# Patient Record
Sex: Female | Born: 1960 | Race: White | Hispanic: No | Marital: Married | Smoking: Current every day smoker
Health system: Southern US, Community
[De-identification: ages and names within clinical notes are randomized; demographics above are authoritative.]

## PROBLEM LIST (undated history)

## (undated) DIAGNOSIS — G629 Polyneuropathy, unspecified: Secondary | ICD-10-CM

## (undated) DIAGNOSIS — E039 Hypothyroidism, unspecified: Secondary | ICD-10-CM

## (undated) DIAGNOSIS — E119 Type 2 diabetes mellitus without complications: Secondary | ICD-10-CM

## (undated) HISTORY — PX: TUBAL LIGATION: SHX77

## (undated) HISTORY — PX: SHOULDER SURGERY: SHX246

---

## 1998-06-19 ENCOUNTER — Other Ambulatory Visit: Admission: RE | Admit: 1998-06-19 | Discharge: 1998-06-19 | Payer: Self-pay | Admitting: Obstetrics and Gynecology

## 1999-01-18 ENCOUNTER — Other Ambulatory Visit: Admission: RE | Admit: 1999-01-18 | Discharge: 1999-01-18 | Payer: Self-pay | Admitting: Obstetrics and Gynecology

## 1999-10-19 ENCOUNTER — Ambulatory Visit (HOSPITAL_COMMUNITY): Admission: RE | Admit: 1999-10-19 | Discharge: 1999-10-19 | Payer: Self-pay | Admitting: Obstetrics and Gynecology

## 2000-03-14 ENCOUNTER — Other Ambulatory Visit: Admission: RE | Admit: 2000-03-14 | Discharge: 2000-03-14 | Payer: Self-pay | Admitting: Obstetrics and Gynecology

## 2001-04-30 ENCOUNTER — Other Ambulatory Visit: Admission: RE | Admit: 2001-04-30 | Discharge: 2001-04-30 | Payer: Self-pay | Admitting: Obstetrics and Gynecology

## 2002-07-10 ENCOUNTER — Encounter: Admission: RE | Admit: 2002-07-10 | Discharge: 2002-07-10 | Payer: Self-pay | Admitting: Obstetrics and Gynecology

## 2002-07-10 ENCOUNTER — Encounter: Payer: Self-pay | Admitting: Obstetrics and Gynecology

## 2003-01-14 ENCOUNTER — Ambulatory Visit (HOSPITAL_COMMUNITY): Admission: RE | Admit: 2003-01-14 | Discharge: 2003-01-14 | Payer: Self-pay | Admitting: Endocrinology

## 2003-01-14 ENCOUNTER — Encounter: Payer: Self-pay | Admitting: Endocrinology

## 2003-07-25 ENCOUNTER — Encounter: Admission: RE | Admit: 2003-07-25 | Discharge: 2003-07-25 | Payer: Self-pay | Admitting: Obstetrics and Gynecology

## 2004-06-10 ENCOUNTER — Inpatient Hospital Stay (HOSPITAL_COMMUNITY): Admission: EM | Admit: 2004-06-10 | Discharge: 2004-06-11 | Payer: Self-pay | Admitting: Internal Medicine

## 2004-08-06 ENCOUNTER — Encounter: Admission: RE | Admit: 2004-08-06 | Discharge: 2004-08-06 | Payer: Self-pay | Admitting: Obstetrics and Gynecology

## 2005-10-05 ENCOUNTER — Encounter: Admission: RE | Admit: 2005-10-05 | Discharge: 2005-10-05 | Payer: Self-pay | Admitting: Obstetrics and Gynecology

## 2006-02-07 ENCOUNTER — Ambulatory Visit (HOSPITAL_COMMUNITY): Admission: RE | Admit: 2006-02-07 | Discharge: 2006-02-07 | Payer: Self-pay | Admitting: Endocrinology

## 2006-03-10 ENCOUNTER — Inpatient Hospital Stay (HOSPITAL_COMMUNITY): Admission: EM | Admit: 2006-03-10 | Discharge: 2006-03-11 | Payer: Self-pay | Admitting: Emergency Medicine

## 2006-11-07 ENCOUNTER — Encounter: Admission: RE | Admit: 2006-11-07 | Discharge: 2006-11-07 | Payer: Self-pay | Admitting: Obstetrics and Gynecology

## 2007-10-12 ENCOUNTER — Ambulatory Visit (HOSPITAL_BASED_OUTPATIENT_CLINIC_OR_DEPARTMENT_OTHER): Admission: RE | Admit: 2007-10-12 | Discharge: 2007-10-12 | Payer: Self-pay | Admitting: Orthopedic Surgery

## 2009-05-13 ENCOUNTER — Encounter: Admission: RE | Admit: 2009-05-13 | Discharge: 2009-05-13 | Payer: Self-pay | Admitting: Endocrinology

## 2010-05-24 ENCOUNTER — Other Ambulatory Visit: Payer: Self-pay | Admitting: Obstetrics and Gynecology

## 2010-05-24 DIAGNOSIS — Z1231 Encounter for screening mammogram for malignant neoplasm of breast: Secondary | ICD-10-CM

## 2010-05-31 ENCOUNTER — Ambulatory Visit
Admission: RE | Admit: 2010-05-31 | Discharge: 2010-05-31 | Disposition: A | Discharge status: Home / Self Care | Payer: Managed Care, Other (non HMO) | Source: Ambulatory Visit | Attending: Obstetrics and Gynecology | Admitting: Obstetrics and Gynecology

## 2010-05-31 DIAGNOSIS — Z1231 Encounter for screening mammogram for malignant neoplasm of breast: Secondary | ICD-10-CM

## 2010-08-31 NOTE — Op Note (Signed)
NAME:  [name] [name], [name] [name]            ACCOUNT NO.:  [id number]   MEDICAL RECORD NO.:  [id number]          PATIENT TYPE:  AMB   LOCATION:  DSC                          FACILITY:  [facility]   PHYSICIAN:  Harvie Junior, M.D.   DATE OF BIRTH:  09/19/1960   DATE OF PROCEDURE:  DATE OF DISCHARGE:                               OPERATIVE REPORT   PREOPERATIVE DIAGNOSIS:  Stiff shoulder with impingement.   POSTOPERATIVE DIAGNOSES:  1. Stiff shoulder with impingement.  2. Labral tear posterior superior.   PROCEDURE:  1. Arthroscopic acromioplasty from the lateral and posterior      compartment.  2. Arthroscopic debridement in the humeral joint of posterior and      superior labral tear as well as scare tissue from manipulation.  3. Manipulations   ANESTHESIA:  General.   SURGEON:  Harvie Junior, MD.   ANESTHESIA:  General.   BRIEF HISTORY:  [name] is a 50 year old female with a long history  of having stiff right shoulder, which we treated conservatively for a  period of time and done injection therapy which had initially worked but  then failed.  We had attempted trying to get herself cured  nonoperatively because of her continued complaint of stiffness and pain,  she was also taken to the operating room for subacromial decompression  and manipulation under anesthesia.   PROCEDURE:  The patient was taken to the operating room.  After adequate  anesthesia obtained with general anesthetic the patient was placed  in  beach-chair position, all bony prominence was well padded.  At this  point, the patient was manipulated and easily with the break of scar  tissue into his 70 of external rotation, 180 degrees of right elevation,  and 90 internal rotation with the arm at the side.  We could manipulate  her arm behind her back.  At this point, the shoulder was prepped and  draped in usual sterile fashion. Following this routine arthroscopic  examination revealed the glenohumeral joint there  was a posterior  superior labral tear as well as anterior significant scar and synovium  from the tear.  We then debrided the posterior and superior labral tears  as well as anterior scar tissue and then took the blunt portion of the  shaver and went up in between the superior labrum and rotator cuff on  the undersurface to free this area off.  Once this was completed, the  rotator cuff was evaluated undersurface noted to be normal.  Attention  at this time was turned to the glenohumeral joint and subacromial space  and anterior lateral acromioplasty was performed to lateral and  posterior compartment.  There was a large pretty sharp spur right in the  central portion of the acromion.  Once acromioplasty was performed, then  the subtotal bursectomy was performed anterior and posterior mid and  also medially.  Once this was completed, the shoulder was put through  range of motion.  There was no impinging or limiting factors at this  point.  At this point, the shoulder was copiously and  thoroughly irrigated, suctioned dry, all subportals  were closed with a  bandage.  Sterile compression dressing was applied.  The patient taken  to the recovery room and she was noted to be in satisfactory condition.  Estimated blood loss for this procedure was none.      Harvie Junior, M.D.  Electronically Signed     JLG/MEDQ  D:  10/12/2007  T:  10/13/2007  Job:  [id number]

## 2010-09-03 NOTE — Op Note (Signed)
[facility]  Patient:    [name] [name], [name] [name]                   MRN: [id number] Proc. Date: 10/19/99 Adm. Date:  [id number] Attending:  Malon Kindle                           Operative Report  PREOPERATIVE DIAGNOSES:  Voluntary sterilization, diabetes.  POSTOPERATIVE DIAGNOSES:  Voluntary sterilization, diabetes.  OPERATION FORMED:  Laparoscopic tubal cauterization.  SURGEON:  Malachi Pro. Ambrose Mantle, M.D.  ANESTHESIA:  General.  DESCRIPTION OF PROCEDURE:  The patient was brought to the operating room and placed under satisfactory general anesthesia. Her last blood sugar had been 187. She was placed in the [place] stirrups. The abdomen, vulva, vagina and urethra were prepped with Betadine solution and draped as a sterile field after the bladder was emptied with a [place] catheter and a [place] cannula was placed into the uterus. The uterus was anterior small. The adnexa were free of masses. A small incision was made in the inferior portion of the umbilicus. A [place] cannula was placed into the abdominal cavity. Aspiration revealed it was not in a blood vessel, not in bowel or urinary contents. Saline was pushed in, none could be recovered. I then elevated the abdominal wall and got a negative pressure. I then placed 2.8 liters of CO2 with a filling pressure of 4-8 mmHg. The incision was slightly enlarged so the laparoscopic trocar could be inserted without difficulty. I confirmed its intraperitoneal position and then placed a smaller trocar suprapubically under direct vision. The blunt probe was used to manipulate the pelvic organs into operating position. The uterus was anterior and small. Both tubes were delicate and normal to their fimbriated ends. Both ovaries were normal and the cul-de-sacs were free of disease and the uterus was normal size. Exploration of the upper abdomen revealed the liver to be smooth. I did not see the gallbladder. I did not  see any upper abdominal abnormalities. I cauterized both tubes in 3 or 4 consecutive locations starting with the mid portion of each tube and going toward the cornu. I continued the cauterization at each location until the meter read 0 and the tube appeared white. No complications were encountered. The lower abdominal trocar was removed. No bleeding was present at that site. I then released all the pneumoperitoneum, removed the umbilical trocar and then slowly removed the laparoscope to visualize the umbilical incision and there was no bleeding. I closed the umbilical incision with 3 sutures of 3-0 plain catgut, the lower incision with several Steri-Strips. The Hulka cannula was removed and the patient returned to recovery in satisfactory condition. Blood loss was less than 5 cc. DD:  10/19/99 TD:  10/19/99 Job: [id number]

## 2010-09-03 NOTE — Discharge Summary (Signed)
NAME:  [name], [name]            ACCOUNT NO.:  [id number]   MEDICAL RECORD NO.:  [id number]          PATIENT TYPE:  INP   LOCATION:  0153                         FACILITY:  [facility]   PHYSICIAN:  Gaspar Garbe, M.D.DATE OF BIRTH:  10-Apr-1961   DATE OF ADMISSION:  06/10/2004  DATE OF DISCHARGE:  06/11/2004                                 DISCHARGE SUMMARY   DISCHARGE DIAGNOSES:  1.  Diabetic ketoacidosis.  2.  Diabetes mellitus type 1, on MiniMed Paradigm pump.  3.  Hyperlipidemia.  4.  Hypothyroidism.  5.  Mild elevated blood pressure.   MEDICATIONS AT DISCHARGE:  1.  Lantus 18 units subcu evening of discharge. Continue sliding scale with      Humalog with pump except using syringes.  2.  The patient is to resume her pump tomorrow after replacement arrives per      [employer] by [employer]Ex by noon. She is to monitor her blood sugars every two      hours through the evening time on the initiation of pump.  3.  Zocor 20 mg p.o. q.d.  4.  Altace 2.5 mg p.o. q.d.  5.  Synthroid 125 mg p.o. q.d.  6.  The patient may stop her Avandia.   SUMMARY OF HOSPITAL COURSE:  The patient was admitted from the office  June 10, 2004 after having nausea and vomiting for two days. She notes  that her blood sugars were high on February 22 and was talked through giving  boluses by syringe. She reset her pump, used new infusion sets and new  insulin, and continued to have problems through to the morning of February  23 where she was seen in the office, found to be ketotic, and unable to keep  down p.o. fluids. She was directly admitted to [facility] critical care  step-down area where she received insulin per Glucomander, IV fluids,  converted over to IV fluids with D5 once achieving relative normal glycemia  and was hydrated overnight.   The patient was started with her MiniMed Paradigm pump; however, had a  malfunction the morning of her discharge. She was seen by the [employer]  representative  and her educators. Arrangements were made for her to get a  new pump sent by Fed-Ex tomorrow. In the meantime, she has been using  Humalog per sliding scale and will resume this sliding scale at home. I  brought her a sample bottle of Lantus which she has used before to use  evening at 18 units. Her normal pump basals are between 20 and 22 units. She  is continued to monitor her sugars closely until she resumes the pump and  following resumption of her pump to keep her logs and call the office on  Monday with an update with what is going on.   The patient indicated that the [employer] educator found that she was not  inserting her cannula correctly although she has been doing the exactly the  same way for 11 months now. The patient brings this into question. I am  considering that she may need to use a different type of inserter  if this is  the case or else she may do better with a different, less complicated  inserter as well. This will be discussed with Ronal Fear, our pharmacist  and diabetic educator on upcoming appointment. The patient was discharged in  good condition in the care of her husband.   LABORATORY DATA:  Sodium 136, potassium 3.2, glucose 101 done at noon. White  count had dropped from 17.3 to 12.6 with hydration. Normal platelets 299.  Normal hemoglobin and hematocrit of 12.1 and 34.7. Anion gap had closed. BUN  and creatinine are 10 and 0.7 respectively with a calcium of 8.7, chloride  of 104 with a bicarb up to 27 from 18 previously. The patient had a slightly  suppressed TSH of 0.328. No adjustment was made in her medication. She had a  negative nasal swab, had a negative chest x-ray, and had had a negative  urinalysis for infection at the office.   DISCHARGE INSTRUCTIONS:  As listed above for reinitiation of pump and to  call on Monday.   Condition on discharge:  Good      RWT/MEDQ  D:  06/11/2004  T:  06/12/2004  Job:  [id number]

## 2010-09-03 NOTE — H&P (Signed)
NAME:  [name] [name], [name] [name]            ACCOUNT NO.:  [id number]   MEDICAL RECORD NO.:  [id number]          PATIENT TYPE:  INP   LOCATION:  0153                         FACILITY:  [facility]   PHYSICIAN:  Gaspar Garbe, M.D.DATE OF BIRTH:  April 01, 1961   DATE OF ADMISSION:  06/10/2004  DATE OF DISCHARGE:                                HISTORY & PHYSICAL   CHIEF COMPLAINT:  Nausea and vomiting, diabetic ketoacidosis.   HISTORY OF PRESENT ILLNESS:  The patient is a 50 year old white female who  has a history of diabetes mellitus type 1 on insulin pump.  She has had  recurrent pump, a Paradigm MiniMed for approximately three years now.  She  first began having difficulties with her blood sugars about two to three  days ago and over the course of yesterday, was given insulin by direct  injection rather than her pump and was able to achieve relative normal  glycemia with blood sugars in the 110-150 range as they had been greater  than 500 previously.  Over the course of the night, her blood sugars  worsened and her blood sugar this morning was reported in the upper to mid  500's, came into the office and her blood sugar was 496.  Her urinalysis  revealed 2+ ketones.  She is therefore being admitted to the hospital for  diabetic ketoacidosis, likely due to pump malfunction.   ALLERGIES:  No known drug allergies.   MEDICATIONS:  1.  Humalog per insulin pump.  2.  Altace 2.5 mg per day.  3.  Avandia 4 mg daily.  4.  Synthroid 125 mcg daily.  5.  Zocor 20 mg daily.   PAST MEDICAL HISTORY:  1.  Diabetes mellitus type 1 in 1995.  She had no unusual presentation and      has some aspects of type 2 which is why, per Dr. Evlyn Kanner, the Avandia had      been used.  2.  Hyperlipidemia.  3.  Hypothyroidism.   SOCIAL HISTORY:  The patient lives in [place] with her husband.  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   FAMILY HISTORY:  Mother  has a history of primary biliary cirrhosis.  Father  was diagnosed with brain cancer in November of this past year.   REVIEW OF SYSTEMS:  The patient complains mostly of weakness, nausea and  vomiting.  She was unable to sit up fully and has been unable to keep down  fluids or food within the past one to two days reliably.  She denies any  fevers or chills, indicates that she has a little bit of a headache but  denies any palpitations.   ADVANCED DIRECTIVES:  The patient is a full code.   PHYSICAL EXAMINATION:  VITAL SIGNS:  Temperature 98.5, pulse 138,  respiratory rate 18, blood pressure 144/72, sating 100% on room air.  GENERAL:  The patient is ill-appearing, appears to be uncomfortable.  HEENT:  Normocephalic, atraumatic.  PERRLA.  EOMI.  ENT is within normal  limits.  NECK:  Supple.  No lymphadenopathy, JVD or bruits.  HEART:  Tachycardic.  No murmur appreciated.  ABDOMEN:  Soft, mildly tender secondary to retching with hyperactive bowel  sounds.  EXTREMITIES:  No cyanosis, clubbing or edema.  MUSCULOSKELETAL:  A 5/5 strength bilaterally.  NEUROLOGICAL:  Oriented x3.  Cranial nerves II-XII are intact without  photophobia.   LABORATORIES:  Done in the office show a CBG of 496 and 2+ ketones in her  urine.  The rest are currently pending for admission.   ASSESSMENT AND PLAN:  1.  Diabetic ketoacidosis.  The patient's diabetic ketoacidosis is most      likely secondary to the pump not working properly.  Will try to contact      [employer] in the meantime and will retry her pump with a new set,      reservoir, insulin once she is out of diabetic ketoacidosis.  At this      point, she is being admitted to the step down area of [facility] with IV sedation, normal saline 200 cc an hour to be switched      over to D-5 one-half at 125 when she has blood sugars less than 200 x2      hours.  She will be on a Glucometer protocol with a goal blood sugar of      100.  This  will be managed through Regular Insulin IV drop.  She is to      receive 10 units of Humalog subcutaneous upon arrival.  2.  Hyperlipidemia.  We will continue her on her Zocor if she can hold it      down, otherwise will hold.  3.  Question respiratory illness.  The patient indicates that she had some      flu-like symptoms which I suspect are most likely diabetic ketoacidosis.      Will check her with a flu swab and a chest x-ray just to make sure there      is no evidence of bacteria on her urinalysis.  4.  Protonix will be given for gastrointestinal prophylaxis.  Will hold her      off on Lovenox as she will most likely not be bed bound or hospitalized      for a long period of time.  5.  Will receive Phenergan for nausea, Tylenol for fever as needed.      RWT/MEDQ  D:  06/10/2004  T:  06/10/2004  Job:  [id number]   cc:   Jeannett Senior A. Evlyn Kanner, M.D.  [address]  [place]  [place] [address]  Fax: [phone]

## 2010-09-03 NOTE — H&P (Signed)
NAME:  [name], [name]            ACCOUNT NO.:  [id number]   MEDICAL RECORD NO.:  [id number]           PATIENT TYPE:   LOCATION:                                 FACILITY:   PHYSICIAN:  Geoffry Paradise, M.D.       DATE OF BIRTH:   DATE OF ADMISSION:  03/10/2006  DATE OF DISCHARGE:                                HISTORY & PHYSICAL   CHIEF COMPLAINT:  Nausea and vomiting, DKA.   HISTORY OF PRESENT ILLNESS:  [name] is a 50 year old patient of Dr.  Kassie Mends with longstanding diabetes mellitus type 1 since 1995, presenting at  this time with nausea, vomiting and elevated blood sugars since  approximately 7 a.m. this morning.  She has had one prior episode of DKA  which she relates secondary to pump failure in the past.  For the most part,  she relates that she is well controlled on an insulin pump and fairly  compliant.  She evidently has felt extremely well with no problems until  awakening at approximately 6 or 7 a.m. this morning with protracted nausea  and vomiting.  Blood sugars have been elevated all day and nausea and  vomiting have continued all day, until I was contacted at approximately 9:30  p.m. resulting in my encouragement of them to present to the emergency room.  In the emergency room, the patient was found to be in frank DKA and she is  admitted for IV fluids and insulin.  She claims that she has given herself  supplemental insulin through the day, but has not checked the pump or  replaced insulin in the pump or insertion sets.  She has had no fever,  chills or sweats.  She denies any headaches or rash.  She has had no  abdominal pain or diarrhea.  She has had no cough, congestion or urinary  symptoms.   PAST MEDICAL HISTORY:  NO KNOWN DRUG ALLERGIES.  Medications include  Synthroid 25 mcg daily, Zocor 20 mg daily, Altace dose unknown.  Medical  illnesses include hypothyroidism, hyperlipidemia and essential hypertension.  Surgical illnesses include a __________  by  Dr. Ambrose Mantle in 1996.   FAMILY HISTORY:  Noncontributory.   SOCIAL HISTORY:  The patient is married and does not have children.  Denies  alcohol or cigarettes.   PHYSICAL EXAM:  Temperature 96.7, blood pressure 80 manual, pulse  120/regular, respiratory rate 26, O2 saturation 96%. The patient appears  ill, just vomited, but awake, alert, interactive.  HEENT:  Anicteric.  Pupils are round and reactive.  NECK:  No JVD or bruits.  No meningismus.  LUNGS:  Clear bilaterally to auscultation and percussion.  CARDIOVASCULAR:  Regular rate and rhythm, tachy.  No murmur, gallop, rub or  heave.  No S3 or S4.  ABDOMEN:  Good bowel sounds.  Soft, nontender.  No hepatosplenomegaly.  BACK:  No CVA or spinal tenderness.  EXTREMITIES:  Without cyanosis, clubbing or edema.  Warm distally.  Intact  distal pulses.  Joints normal.  No rashes.  NEUROLOGICAL:  She is awake, alert, higher cortical functioning intact.  Moves extremities times four.  She does have an EJ in the left neck.   ASSESSMENT:  1. Diabetic ketoacidosis.  No obvious precipitating infectious etiology,      possible pump or insertion difficulty, but clearly no attempts      throughout the day to intervene.  2. Hypothyroidism.  3. Hyperlipidemia.  4. Essential hypertension.  5. Renal failure, unclear, acute versus chronic.   PLAN:  The patient is admitted for aggressive hydration, continuous insulin  administration, antiemetics and further pending her course.           ______________________________  Geoffry Paradise, M.D.     RA/MEDQ  D:  03/10/2006  T:  03/10/2006  Job:  [id number]

## 2010-09-03 NOTE — Discharge Summary (Signed)
NAME:  [name], [name]            ACCOUNT NO.:  [id number]   MEDICAL RECORD NO.:  [id number]          PATIENT TYPE:  INP   LOCATION:  0162                         [facility]ACILITY:  [facility]   PHYSICIAN:  Tera Mater. Evlyn Kanner, M.D. DATE OF BIRTH:  02-02-1961   DATE OF ADMISSION:  03/09/2006  DATE OF DISCHARGE:  03/11/2006                               DISCHARGE SUMMARY   DISCHARGE DIAGNOSES:  1. Severe diabetic ketoacidosis due to dysfunction.  2. Acute renal failure, resolved.  3. Hypertension.  4. Hypothyroidism.   [name] is a 50 year old white female, longstanding type 1 diabetic  patient of mine. She presented to my partner, Dr. Jacky Kindle, on the early  morning hours of March 10, 2006 with severe diabetic ketoacidosis.  She apparently has had nausea and vomiting and elevated blood sugars  from 7 a.m. that morning. She has only had one episode of DKA in the  past which was also associated with pump failure. She was fairly sick at  presentation. She was hypotensive, tachycardic, with marked metabolic  disarray. Fortunately, since this came on in a fairly short period of  time, with IV fluids and IV insulin and appropriate resuscitative  measures, she turned around fairly quickly. She had IV Glucomander used.  She was transitioned back to her pump when she was taking food in. There  was no evidence of an infectious cause underlying this. In general, it  was noted she turned around fairly quickly despite the severity of  metabolic disarray.   LABORATORY DATA AT PRESENTATION:  PH 7.041, pCO2 of 18, pO2 of 131.  Repeat at 1 a.m., 7.117, 18 and 127. Initial white count was 28,500,  dropped down to 19,900 on the 24th. Initial hemoglobin 14.6, dropped to  11.6 with hydration. Platelets 436,000, dropping down to 262,000.  Initial chemistries:  Sodium 127, potassium 6.4, chloride 92, CO2 6, BUN  42, creatinine 2.3, glucose of 994, calcium 9.6, total protein 7,  albumin 4.2. Gradually, her  acidosis resolved. Her final chemistries:  Sodium 136, potassium 2.9, chloride 110, CO2 23, BUN 9, creatinine 0.7,  glucose of 95, calcium 9.1. Total bilirubin was up at presentation at  1.8. AST was normal at 27, ALT 27, alkaline phosphatase of 105. A false  positive urinary pregnancy test was noted that was confirmed to be  clearly negative by quantitive HCG; not sure of the reason for this, but  the patient had her period 2 weeks before and clearly was not pregnant.  Serum acetone was positive at presentation. There was 40 mg of ketone in  her urine.   In summary, we have a 50 year old white female with known type 1  diabetes developing nausea and vomiting and diabetic ketoacidosis due to  pump dysfunction. She was in significant metabolic disarray, but  fortunately, her general health is good, and she turned around quickly.  A new pump was delivered, and she was placed on this and discharged home  in improved condition on March 11, 2006. Her diet was to be as  before. She was to use her prior pump settings. She was to return to her  medications of Synthroid, Zocor, and Altace. She is to follow up with me  in one week.           ______________________________  Tera Mater. Evlyn Kanner, M.D.    SAS/MEDQ  D:  04/13/2006  T:  04/13/2006  Job:  [id number]

## 2011-01-13 LAB — POCT I-STAT, CHEM 8
Calcium, Ion: 1.17
Chloride: 102
HCT: 36
Hemoglobin: 12.2
Potassium: 4.2
TCO2: 26

## 2011-01-13 LAB — POCT HEMOGLOBIN-HEMACUE: Hemoglobin: 13

## 2014-10-23 ENCOUNTER — Ambulatory Visit
Admission: RE | Admit: 2014-10-23 | Discharge: 2014-10-23 | Disposition: A | Discharge status: Home / Self Care | Payer: Managed Care, Other (non HMO) | Source: Ambulatory Visit

## 2014-10-23 ENCOUNTER — Other Ambulatory Visit: Payer: Self-pay

## 2014-10-23 DIAGNOSIS — Z1231 Encounter for screening mammogram for malignant neoplasm of breast: Secondary | ICD-10-CM

## 2015-10-19 ENCOUNTER — Other Ambulatory Visit: Payer: Self-pay | Admitting: Endocrinology

## 2015-10-19 DIAGNOSIS — Z1231 Encounter for screening mammogram for malignant neoplasm of breast: Secondary | ICD-10-CM

## 2015-10-30 ENCOUNTER — Ambulatory Visit
Admission: RE | Admit: 2015-10-30 | Discharge: 2015-10-30 | Disposition: A | Discharge status: Home / Self Care | Payer: Managed Care, Other (non HMO) | Source: Ambulatory Visit | Attending: Endocrinology | Admitting: Endocrinology

## 2015-10-30 DIAGNOSIS — Z1231 Encounter for screening mammogram for malignant neoplasm of breast: Secondary | ICD-10-CM

## 2016-12-26 ENCOUNTER — Other Ambulatory Visit: Payer: Self-pay | Admitting: Endocrinology

## 2016-12-26 DIAGNOSIS — Z1231 Encounter for screening mammogram for malignant neoplasm of breast: Secondary | ICD-10-CM

## 2017-01-03 ENCOUNTER — Ambulatory Visit
Admission: RE | Admit: 2017-01-03 | Discharge: 2017-01-03 | Disposition: A | Discharge status: Home / Self Care | Payer: Managed Care, Other (non HMO) | Source: Ambulatory Visit | Attending: Endocrinology | Admitting: Endocrinology

## 2017-01-03 ENCOUNTER — Encounter: Payer: Self-pay | Admitting: Radiology

## 2017-01-03 DIAGNOSIS — Z1231 Encounter for screening mammogram for malignant neoplasm of breast: Secondary | ICD-10-CM

## 2018-04-27 ENCOUNTER — Other Ambulatory Visit: Payer: Self-pay | Admitting: Endocrinology

## 2018-04-27 DIAGNOSIS — Z1231 Encounter for screening mammogram for malignant neoplasm of breast: Secondary | ICD-10-CM

## 2018-05-28 ENCOUNTER — Ambulatory Visit
Admission: RE | Admit: 2018-05-28 | Discharge: 2018-05-28 | Disposition: A | Discharge status: Home / Self Care | Payer: 59 | Source: Ambulatory Visit | Attending: Endocrinology | Admitting: Endocrinology

## 2018-05-28 DIAGNOSIS — Z1231 Encounter for screening mammogram for malignant neoplasm of breast: Secondary | ICD-10-CM

## 2019-04-09 ENCOUNTER — Other Ambulatory Visit: Payer: Self-pay

## 2019-04-09 ENCOUNTER — Ambulatory Visit (HOSPITAL_COMMUNITY)
Admission: RE | Admit: 2019-04-09 | Discharge: 2019-04-09 | Disposition: A | Discharge status: Home / Self Care | Payer: 59 | Source: Ambulatory Visit | Attending: Family | Admitting: Family

## 2019-04-09 ENCOUNTER — Other Ambulatory Visit (HOSPITAL_COMMUNITY): Payer: Self-pay | Admitting: Endocrinology

## 2019-04-09 DIAGNOSIS — R0989 Other specified symptoms and signs involving the circulatory and respiratory systems: Secondary | ICD-10-CM | POA: Diagnosis present

## 2019-04-27 ENCOUNTER — Other Ambulatory Visit: Payer: Self-pay

## 2019-04-27 ENCOUNTER — Ambulatory Visit (INDEPENDENT_AMBULATORY_CARE_PROVIDER_SITE_OTHER): Payer: 59

## 2019-04-27 ENCOUNTER — Encounter: Payer: Self-pay | Admitting: Podiatry

## 2019-04-27 ENCOUNTER — Ambulatory Visit: Payer: 59 | Admitting: Podiatry

## 2019-04-27 VITALS — BP 151/83 | HR 82 | Resp 16

## 2019-04-27 DIAGNOSIS — M79673 Pain in unspecified foot: Secondary | ICD-10-CM

## 2019-04-27 DIAGNOSIS — M2041 Other hammer toe(s) (acquired), right foot: Secondary | ICD-10-CM

## 2019-04-27 DIAGNOSIS — E1149 Type 2 diabetes mellitus with other diabetic neurological complication: Secondary | ICD-10-CM

## 2019-04-27 DIAGNOSIS — M79672 Pain in left foot: Secondary | ICD-10-CM

## 2019-04-27 DIAGNOSIS — M216X9 Other acquired deformities of unspecified foot: Secondary | ICD-10-CM

## 2019-04-27 DIAGNOSIS — M79671 Pain in right foot: Secondary | ICD-10-CM

## 2019-04-27 DIAGNOSIS — M2042 Other hammer toe(s) (acquired), left foot: Secondary | ICD-10-CM

## 2019-04-27 MED ORDER — GABAPENTIN 300 MG PO CAPS: 300.0000 mg | ORAL_CAPSULE | Freq: Every day | ORAL | 0 refills | Status: DC

## 2019-04-27 NOTE — Patient Instructions (Signed)
I have ordered a nerve conduction test. If you do not hear for them about scheduling within the next 1 week, or you have any questions please give [place] a call at [phone].   Gabapentin capsules or tablets What is this medicine? GABAPENTIN (GA ba pen tin) is used to control seizures in certain types of epilepsy. It is also used to treat certain types of nerve pain. This medicine may be used for other purposes; ask your health care provider or pharmacist if you have questions. COMMON BRAND NAME(S): Active-PAC with Gabapentin, Gabarone, Neurontin What should I tell my health care provider before I take this medicine? They need to know if you have any of these conditions:  history of drug abuse or alcohol abuse problem  kidney disease  lung or breathing disease  suicidal thoughts, plans, or attempt; a previous suicide attempt by you or a family member  an unusual or allergic reaction to gabapentin, other medicines, foods, dyes, or preservatives  pregnant or trying to get pregnant  breast-feeding How should I use this medicine? Take this medicine by mouth with a glass of water. Follow the directions on the prescription label. You can take it with or without food. If it upsets your stomach, take it with food. Take your medicine at regular intervals. Do not take it more often than directed. Do not stop taking except on your doctor's advice. If you are directed to break the 600 or 800 mg tablets in half as part of your dose, the extra half tablet should be used for the next dose. If you have not used the extra half tablet within 28 days, it should be thrown away. A special MedGuide will be given to you by the pharmacist with each prescription and refill. Be sure to read this information carefully each time. Talk to your pediatrician regarding the use of this medicine in children. While this drug may be prescribed for children as young as 3 years for selected conditions, precautions do  apply. Overdosage: If you think you have taken too much of this medicine contact a poison control center or emergency room at once. NOTE: This medicine is only for you. Do not share this medicine with others. What if I miss a dose? If you miss a dose, take it as soon as you can. If it is almost time for your next dose, take only that dose. Do not take double or extra doses. What may interact with this medicine? This medicine may interact with the following medications:  alcohol  antihistamines for allergy, cough, and cold  certain medicines for anxiety or sleep  certain medicines for depression like amitriptyline, fluoxetine, sertraline  certain medicines for seizures like phenobarbital, primidone  certain medicines for stomach problems  general anesthetics like halothane, isoflurane, methoxyflurane, propofol  local anesthetics like lidocaine, pramoxine, tetracaine  medicines that relax muscles for surgery  narcotic medicines for pain  phenothiazines like chlorpromazine, mesoridazine, prochlorperazine, thioridazine This list may not describe all possible interactions. Give your health care provider a list of all the medicines, herbs, non-prescription drugs, or dietary supplements you use. Also tell them if you smoke, drink alcohol, or use illegal drugs. Some items may interact with your medicine. What should I watch for while using this medicine? Visit your doctor or health care provider for regular checks on your progress. You may want to keep a record at home of how you feel your condition is responding to treatment. You may want to share this information with your  doctor or health care provider at each visit. You should contact your doctor or health care provider if your seizures get worse or if you have any new types of seizures. Do not stop taking this medicine or any of your seizure medicines unless instructed by your doctor or health care provider. Stopping your medicine  suddenly can increase your seizures or their severity. This medicine may cause serious skin reactions. They can happen weeks to months after starting the medicine. Contact your health care provider right away if you notice fevers or flu-like symptoms with a rash. The rash may be red or purple and then turn into blisters or peeling of the skin. Or, you might notice a red rash with swelling of the face, lips or lymph nodes in your neck or under your arms. Wear a medical identification bracelet or chain if you are taking this medicine for seizures, and carry a card that lists all your medications. You may get drowsy, dizzy, or have blurred vision. Do not drive, use machinery, or do anything that needs mental alertness until you know how this medicine affects you. To reduce dizzy or fainting spells, do not sit or stand up quickly, especially if you are an older patient. Alcohol can increase drowsiness and dizziness. Avoid alcoholic drinks. Your mouth may get dry. Chewing sugarless gum or sucking hard candy, and drinking plenty of water will help. The use of this medicine may increase the chance of suicidal thoughts or actions. Pay special attention to how you are responding while on this medicine. Any worsening of mood, or thoughts of suicide or dying should be reported to your health care provider right away. Women who become pregnant while using this medicine may enroll in the [place] Pregnancy Registry by calling [phone]. This registry collects information about the safety of antiepileptic drug use during pregnancy. What side effects may I notice from receiving this medicine? Side effects that you should report to your doctor or health care professional as soon as possible:  allergic reactions like skin rash, itching or hives, swelling of the face, lips, or tongue  breathing problems  rash, fever, and swollen lymph nodes  redness, blistering, peeling or loosening of  the skin, including inside the mouth  suicidal thoughts, mood changes Side effects that usually do not require medical attention (report to your doctor or health care professional if they continue or are bothersome):  dizziness  drowsiness  headache  nausea, vomiting  swelling of ankles, feet, hands  tiredness This list may not describe all possible side effects. Call your doctor for medical advice about side effects. You may report side effects to FDA at 1-800-FDA-1088. Where should I keep my medicine? Keep out of reach of children. This medicine may cause accidental overdose and death if it taken by other adults, children, or pets. Mix any unused medicine with a substance like cat litter or coffee grounds. Then throw the medicine away in a sealed container like a sealed bag or a coffee can with a lid. Do not use the medicine after the expiration date. Store at room temperature between 15 and 30 degrees C (59 and 86 degrees F). NOTE: This sheet is a summary. It may not cover all possible information. If you have questions about this medicine, talk to your doctor, pharmacist, or health care provider.  2020 Elsevier/Gold Standard (2018-07-06 14:16:43)   Diabetes Mellitus and Foot Care Foot care is an important part of your health, especially when you have diabetes.  Diabetes may cause you to have problems because of poor blood flow (circulation) to your feet and legs, which can cause your skin to:  Become thinner and drier.  Break more easily.  Heal more slowly.  Peel and crack. You may also have nerve damage (neuropathy) in your legs and feet, causing decreased feeling in them. This means that you may not notice minor injuries to your feet that could lead to more serious problems. Noticing and addressing any potential problems early is the best way to prevent future foot problems. How to care for your feet Foot hygiene  Wash your feet daily with warm water and mild soap. Do not  use hot water. Then, pat your feet and the areas between your toes until they are completely dry. Do not soak your feet as this can dry your skin.  Trim your toenails straight across. Do not dig under them or around the cuticle. File the edges of your nails with an emery board or nail file.  Apply a moisturizing lotion or petroleum jelly to the skin on your feet and to dry, brittle toenails. Use lotion that does not contain alcohol and is unscented. Do not apply lotion between your toes. Shoes and socks  Wear clean socks or stockings every day. Make sure they are not too tight. Do not wear knee-high stockings since they may decrease blood flow to your legs.  Wear shoes that fit properly and have enough cushioning. Always look in your shoes before you put them on to be sure there are no objects inside.  To break in new shoes, wear them for just a few hours a day. This prevents injuries on your feet. Wounds, scrapes, corns, and calluses  Check your feet daily for blisters, cuts, bruises, sores, and redness. If you cannot see the bottom of your feet, use a mirror or ask someone for help.  Do not cut corns or calluses or try to remove them with medicine.  If you find a minor scrape, cut, or break in the skin on your feet, keep it and the skin around it clean and dry. You may clean these areas with mild soap and water. Do not clean the area with peroxide, alcohol, or iodine.  If you have a wound, scrape, corn, or callus on your foot, look at it several times a day to make sure it is healing and not infected. Check for: ? Redness, swelling, or pain. ? Fluid or blood. ? Warmth. ? Pus or a bad smell. General instructions  Do not cross your legs. This may decrease blood flow to your feet.  Do not use heating pads or hot water bottles on your feet. They may burn your skin. If you have lost feeling in your feet or legs, you may not know this is happening until it is too late.  Protect your feet  from hot and cold by wearing shoes, such as at the beach or on hot pavement.  Schedule a complete foot exam at least once a year (annually) or more often if you have foot problems. If you have foot problems, report any cuts, sores, or bruises to your health care provider immediately. Contact a health care provider if:  You have a medical condition that increases your risk of infection and you have any cuts, sores, or bruises on your feet.  You have an injury that is not healing.  You have redness on your legs or feet.  You feel burning or tingling in your  legs or feet.  You have pain or cramps in your legs and feet.  Your legs or feet are numb.  Your feet always feel cold.  You have pain around a toenail. Get help right away if:  You have a wound, scrape, corn, or callus on your foot and: ? You have pain, swelling, or redness that gets worse. ? You have fluid or blood coming from the wound, scrape, corn, or callus. ? Your wound, scrape, corn, or callus feels warm to the touch. ? You have pus or a bad smell coming from the wound, scrape, corn, or callus. ? You have a fever. ? You have a red line going up your leg. Summary  Check your feet every day for cuts, sores, red spots, swelling, and blisters.  Moisturize feet and legs daily.  Wear shoes that fit properly and have enough cushioning.  If you have foot problems, report any cuts, sores, or bruises to your health care provider immediately.  Schedule a complete foot exam at least once a year (annually) or more often if you have foot problems. This information is not intended to replace advice given to you by your health care provider. Make sure you discuss any questions you have with your health care provider. Document Revised: 12/26/2018 Document Reviewed: 05/06/2016 Elsevier Patient Education  2020 [employer].

## 2019-04-27 NOTE — Progress Notes (Signed)
BP (!) 151/83   Pulse 82   Resp 16    Subjective:    Patient ID: Inman, female    DOB: [birth year], 59 y.o.   MRN: [id number]  HPI: Dajuana Palen Kunsman is a 59 y.o. female presenting on 04/27/2019 for concerns of burning to her heels as well as in the balls of her foot.  This been on for greater than 6 months and she also feels that she is unbalanced when she walks and she feels that she rolls on the outside with the right side worse than left.  She also states that she could not lift her right leg up as much as the left side.  She states that she did talk to Dr. Forde Dandy and she was referred to have vascular studies performed which were negative.  She stated discussed neuropathy about 1 year ago.  She denies any claudication symptoms.  No recent falls.  Relevant past medical, surgical, family and social history reviewed and updated as indicated. Interim medical history since our last visit reviewed. Allergies and medications reviewed and updated.  Current Outpatient Medications on File Prior to Visit  Medication Sig  . CONTOUR NEXT TEST test strip   . lisinopril (ZESTRIL) 5 MG tablet Take 5 mg by mouth daily.  Marland Kitchen NOVOLOG 100 UNIT/ML injection   . simvastatin (ZOCOR) 20 MG tablet Take 20 mg by mouth daily.  Marland Kitchen SYNTHROID 125 MCG tablet Take 125 mcg by mouth daily.  . Vitamin D, Ergocalciferol, (DRISDOL) 1.25 MG (50000 UT) CAPS capsule Take 50,000 Units by mouth once a week.   No current facility-administered medications on file prior to visit.    Review of systems: Per HPI unless specifically indicated above     Objective:    BP (!) 151/83   Pulse 82   Resp 16   Wt Readings from Last 3 Encounters:  No data found for Wt    Physical Exam  Results for orders placed or performed during the hospital encounter of 10/12/07  I-STAT, chem 8  Result Value Ref Range   Sodium 137    Potassium 4.2    Chloride 102    BUN 13    Creatinine, Ser 0.8    Glucose, Bld 208 (H)    Calcium, Ion 1.17    TCO2 26    Hemoglobin 12.2    HCT 36.0   Hemoglobin-hemacue, POC  Result Value Ref Range   Hemoglobin 13.0 POINT OF CARE RESULT        General: AAO x3, NAD  Dermatological: Skin is warm, dry and supple bilateral. Nails x 10 are well manicured; remaining integument appears unremarkable at this time. There are no open sores, no preulcerative lesions, no rash or signs of infection present.  Vascular: Dorsalis Pedis artery and Posterior Tibial artery pedal pulses are 2/4 bilateral with immedate capillary fill time.There is no pain with calf compression, swelling, warmth, erythema.   Neruologic: Sensation mildly decreased with Semmes Weinstein monofilament.  Musculoskeletal: Upon weightbearing evaluation there is a cavus foot type with the right side worse than left.  Upon gait evaluation she has excessive supination during gait.  It does appear that her strength in dorsiflexion is mildly decreased on the right side compared to the left side.  Hammertoe contractures are present with the right side worse than left.  Prominence the metatarsal heads plantarly.  No area pinpoint tenderness.  Achilles tendon appears to be intact.  Gait: Unassisted, Nonantalgic.   Assessment &  Plan:  59 y.o. female presents for gait instability, type 2 diabetes with neuropathy  -Treatment options discussed including all alternatives, risks, and complications. I discussed both conservative and surgical treatment options.  -X-rays obtained and reviewed.  No evidence of acute fracture identified. -I discussed that likely her symptoms are coming from neuropathy.  Prescribed gabapentin 300 mg at nighttime and titrate up. -Given the discrepancy on the right versus the left arm also can order nerve conduction test. -Ultimately we discussed orthotics and possible bracing to help with the cavus foot type and prevent her from rolling in the instability. -Likely will start physical therapy but will start  after the nerve conduction test. -Dispensed gel metatarsal pads as well as toe crest. -Discussed the importance of daily foot inspection.  -Follow-up after the nerve conduction test with me and she will also be scheduled with Raiford Noble for the orthotics/possible brace or sooner if any problems arise. In the meantime, encouraged to call the office with any questions, concerns, change in symptoms.   Ovid Curd, DPM

## 2019-04-29 ENCOUNTER — Encounter: Payer: Self-pay | Admitting: Neurology

## 2019-04-29 ENCOUNTER — Telehealth: Payer: Self-pay | Admitting: *Deleted

## 2019-04-29 ENCOUNTER — Other Ambulatory Visit: Payer: Self-pay

## 2019-04-29 DIAGNOSIS — E1149 Type 2 diabetes mellitus with other diabetic neurological complication: Secondary | ICD-10-CM

## 2019-04-29 MED ORDER — GABAPENTIN 100 MG PO CAPS: 100.0000 mg | ORAL_CAPSULE | Freq: Every day | ORAL | 0 refills | Status: DC

## 2019-04-29 NOTE — Telephone Encounter (Signed)
Thanks. If she has any side affects from the 100mg  gabapentin then I would stop it all together.

## 2019-04-29 NOTE — Telephone Encounter (Signed)
I informed pt of Dr. Gabriel Rung 04/29/2019 2:24pm orders. Pt states she is still feeling the effects of the Gabapentin today, and her nerve test is 05/14/2019. Pt states she can't cut the capsules. I offered to order the Gabapentin 100mg  and pt states that would be fine.

## 2019-04-29 NOTE — Telephone Encounter (Signed)
Faxed order to [facility].

## 2019-04-29 NOTE — Telephone Encounter (Signed)
-----   Message from Vivi Barrack, DPM sent at 04/27/2019  9:00 AM EST ----- Can you please order a nerve conduction test? Thanks.

## 2019-04-29 NOTE — Telephone Encounter (Signed)
We can try the 100mg  at night of gabapentin and see how that does or if she wanted we can wait until after the nerve conduction test.

## 2019-04-29 NOTE — Telephone Encounter (Signed)
Pt states she was seen 04/27/2019 and prescribed gabapentin. Pt states she took one and it is too strong, she was loopy all day, and will not take anymore.

## 2019-05-13 ENCOUNTER — Other Ambulatory Visit: Payer: 59 | Admitting: Orthotics

## 2019-05-14 ENCOUNTER — Other Ambulatory Visit: Payer: Self-pay

## 2019-05-14 ENCOUNTER — Ambulatory Visit (INDEPENDENT_AMBULATORY_CARE_PROVIDER_SITE_OTHER): Payer: 59 | Admitting: Neurology

## 2019-05-14 DIAGNOSIS — R208 Other disturbances of skin sensation: Secondary | ICD-10-CM

## 2019-05-14 DIAGNOSIS — E1149 Type 2 diabetes mellitus with other diabetic neurological complication: Secondary | ICD-10-CM

## 2019-05-14 NOTE — Procedures (Signed)
[facility]  [address], [address]  [place], [place] [address] Tel: [phone] Fax:  [phone] Test Date:  05/14/2019  Patient: [name] [name] DOB: 27-Mar-1961 Physician: Narda Amber, DO  Sex: Female Height: 5\' 7"  Ref Phys: Celesta Gentile, DPM  ID#: [id number] Temp: 34.0C Technician:    Patient Complaints: This is a 59 year old female with insulin-dependent diabetes referred for evaluation of bilateral feet burning pain.  NCV & EMG Findings: Extensive electrodiagnostic testing of the right lower extremity and additional studies of the left shows:  1. Bilateral sural and superficial peroneal sensory responses are within normal limits. 2. Bilateral peroneal motor responses at the extensor digitorum brevis is reduced, and normal at the tibialis anterior.  Bilateral tibial motor responses are within normal limits. 3. Bilateral tibial H reflex studies are within normal limits. 4. There is no evidence of active or chronic motor axonal loss changes affecting any of the tested muscles.  Motor unit configuration and recruitment pattern is within normal limits.  Impression: This is a normal study of the lower extremities.  In particular, there is no evidence of a large fiber sensorimotor polyneuropathy or lumbosacral radiculopathy.   ___________________________ Narda Amber, DO    Nerve Conduction Studies Anti Sensory Summary Table   Site NR Peak (ms) Norm Peak (ms) P-T Amp (V) Norm P-T Amp  Left Sup Peroneal Anti Sensory (Ant Lat Mall)  34C  12 cm    2.3 <4.6 10.3 >4  Right Sup Peroneal Anti Sensory (Ant Lat Mall)  34C  12 cm    2.2 <4.6 7.8 >4  Left Sural Anti Sensory (Lat Mall)  34C  Calf    3.4 <4.6 14.6 >4  Right Sural Anti Sensory (Lat Mall)  34C  Calf    3.1 <4.6 16.2 >4   Motor Summary Table   Site NR Onset (ms) Norm Onset (ms) O-P Amp (mV) Norm O-P Amp Site1 Site2 Delta-0 (ms) Dist (cm) Vel (m/s) Norm Vel (m/s)  Left Peroneal Motor (Ext Dig  Brev)  34C  Ankle    4.5 <6.0 1.5 >2.5 B Fib Ankle 8.6 36.0 42 >40  B Fib    13.1  1.3  Poplt B Fib 1.7 7.0 41 >40  Poplt    14.8  1.2         Right Peroneal Motor (Ext Dig Brev)  34C  Ankle    2.5 <6.0 1.8 >2.5 B Fib Ankle 9.7 39.0 40 >40  B Fib    12.2  1.3  Poplt B Fib 1.6 8.0 50 >40  Poplt    13.8  1.3         Left Peroneal TA Motor (Tib Ant)  34C  Fib Head    3.8 <4.5 4.1 >3 Poplit Fib Head 1.4 7.0 50 >40  Poplit    5.2  4.0         Right Peroneal TA Motor (Tib Ant)  34C  Fib Head    4.5 <4.5 4.5 >3 Poplit Fib Head 1.4 7.0 50 >40  Poplit    5.9  4.2         Left Tibial Motor (Abd Hall Brev)  34C  Ankle    4.5 <6.0 8.8 >4 Knee Ankle 9.9 41.0 41 >40  Knee    14.4  5.0         Right Tibial Motor (Abd Hall Brev)  34C  Ankle    5.1 <6.0 8.7 >4 Knee Ankle 10.5 42.0 40 >40  Knee    15.6  7.2          H Reflex Studies   NR H-Lat (ms) Lat Norm (ms) L-R H-Lat (ms)  Left Tibial (Gastroc)  34C     35.51 <35 0.54  Right Tibial (Gastroc)  34C     34.97 <35 0.54   EMG   Side Muscle Ins Act Fibs Psw Fasc Number Recrt Dur Dur. Amp Amp. Poly Poly. Comment  Right AntTibialis Nml Nml Nml Nml Nml Nml Nml Nml Nml Nml Nml Nml N/A  Right Gastroc Nml Nml Nml Nml Nml Nml Nml Nml Nml Nml Nml Nml N/A  Right Flex Dig Long Nml Nml Nml Nml Nml Nml Nml Nml Nml Nml Nml Nml N/A  Right RectFemoris Nml Nml Nml Nml Nml Nml Nml Nml Nml Nml Nml Nml N/A  Right GluteusMed Nml Nml Nml Nml Nml Nml Nml Nml Nml Nml Nml Nml N/A  Left AntTibialis Nml Nml Nml Nml Nml Nml Nml Nml Nml Nml Nml Nml N/A  Left Gastroc Nml Nml Nml Nml Nml Nml Nml Nml Nml Nml Nml Nml N/A  Left Flex Dig Long Nml Nml Nml Nml Nml Nml Nml Nml Nml Nml Nml Nml N/A  Left RectFemoris Nml Nml Nml Nml Nml Nml Nml Nml Nml Nml Nml Nml N/A  Left GluteusMed Nml Nml Nml Nml Nml Nml Nml Nml Nml Nml Nml Nml N/A      Waveforms:

## 2019-05-16 ENCOUNTER — Ambulatory Visit (INDEPENDENT_AMBULATORY_CARE_PROVIDER_SITE_OTHER): Payer: 59 | Admitting: Orthotics

## 2019-05-16 ENCOUNTER — Other Ambulatory Visit: Payer: Self-pay

## 2019-05-16 DIAGNOSIS — M216X9 Other acquired deformities of unspecified foot: Secondary | ICD-10-CM

## 2019-05-16 DIAGNOSIS — E1149 Type 2 diabetes mellitus with other diabetic neurological complication: Secondary | ICD-10-CM

## 2019-05-16 DIAGNOSIS — M2042 Other hammer toe(s) (acquired), left foot: Secondary | ICD-10-CM

## 2019-05-16 DIAGNOSIS — M2041 Other hammer toe(s) (acquired), right foot: Secondary | ICD-10-CM

## 2019-05-28 ENCOUNTER — Encounter: Payer: 59 | Admitting: Orthotics

## 2019-05-28 ENCOUNTER — Other Ambulatory Visit: Payer: Self-pay

## 2019-05-28 NOTE — Progress Notes (Signed)
Patient getting inserts to address acquired pes cavus foot type.  Brace options were discussed; however she wants to see how much control [facility] can offer.

## 2019-06-03 ENCOUNTER — Other Ambulatory Visit: Payer: Self-pay

## 2019-06-03 ENCOUNTER — Ambulatory Visit: Payer: 59 | Admitting: Podiatry

## 2019-06-03 DIAGNOSIS — R2681 Unsteadiness on feet: Secondary | ICD-10-CM

## 2019-06-03 DIAGNOSIS — M216X9 Other acquired deformities of unspecified foot: Secondary | ICD-10-CM

## 2019-06-03 DIAGNOSIS — E1149 Type 2 diabetes mellitus with other diabetic neurological complication: Secondary | ICD-10-CM | POA: Diagnosis not present

## 2019-06-03 NOTE — Patient Instructions (Signed)

## 2019-06-13 NOTE — Progress Notes (Signed)
Subjective: 59 year old female presents the office today for follow-up evaluation of foot pain as well as gait instability and neuropathy.  She previously had a nerve conduction test which was performed and normal.  Overall she does state that she is doing better and the orthotics have been helpful.  She is not quite wearing them full-time but she can notice a difference with wearing them.  She is doing well the current dose of gabapentin.  No recent injury or falls or any other issues. Denies any systemic complaints such as fevers, chills, nausea, vomiting. No acute changes since last appointment, and no other complaints at this time.   Objective: AAO x3, NAD DP/PT pulses palpable bilaterally, CRT less than 3 seconds Overall she does not have any significant discomfort to her foot upon palpation today.  There is a cavus foot type present.  There is supination during gait which is unchanged.  There is no area of tenderness identified at this time.  There is no significant edema, erythema. No open lesions or pre-ulcerative lesions.  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  -Continue current dose of gabapentin.  Also the orthotics have been helpful.  She is to continue to break these in.  Discussed physical therapy which has been making progress in [place] hold off on therapy for now until she gets used to the orthotics.  Overall she reports her gait instability has been improving as well. -Patient encouraged to call the office with any questions, concerns, change in symptoms.   Vivi Barrack DPM

## 2019-06-28 ENCOUNTER — Other Ambulatory Visit: Payer: Self-pay | Admitting: Endocrinology

## 2019-06-28 DIAGNOSIS — Z1231 Encounter for screening mammogram for malignant neoplasm of breast: Secondary | ICD-10-CM

## 2019-07-15 ENCOUNTER — Ambulatory Visit: Payer: 59 | Admitting: Podiatry

## 2019-07-15 ENCOUNTER — Other Ambulatory Visit: Payer: Self-pay

## 2019-07-15 DIAGNOSIS — R2681 Unsteadiness on feet: Secondary | ICD-10-CM

## 2019-07-15 DIAGNOSIS — M216X9 Other acquired deformities of unspecified foot: Secondary | ICD-10-CM | POA: Diagnosis not present

## 2019-07-15 DIAGNOSIS — E1149 Type 2 diabetes mellitus with other diabetic neurological complication: Secondary | ICD-10-CM | POA: Diagnosis not present

## 2019-07-15 MED ORDER — GABAPENTIN 100 MG PO CAPS: 100.0000 mg | ORAL_CAPSULE | Freq: Every day | ORAL | 1 refills | Status: DC

## 2019-07-15 NOTE — Patient Instructions (Signed)
Gabapentin capsules or tablets What is this medicine? GABAPENTIN (GA ba pen tin) is used to control seizures in certain types of epilepsy. It is also used to treat certain types of nerve pain. This medicine may be used for other purposes; ask your health care provider or pharmacist if you have questions. COMMON BRAND NAME(S): Active-PAC with Gabapentin, Gabarone, Neurontin What should I tell my health care provider before I take this medicine? They need to know if you have any of these conditions:  history of drug abuse or alcohol abuse problem  kidney disease  lung or breathing disease  suicidal thoughts, plans, or attempt; a previous suicide attempt by you or a family member  an unusual or allergic reaction to gabapentin, other medicines, foods, dyes, or preservatives  pregnant or trying to get pregnant  breast-feeding How should I use this medicine? Take this medicine by mouth with a glass of water. Follow the directions on the prescription label. You can take it with or without food. If it upsets your stomach, take it with food. Take your medicine at regular intervals. Do not take it more often than directed. Do not stop taking except on your doctor's advice. If you are directed to break the 600 or 800 mg tablets in half as part of your dose, the extra half tablet should be used for the next dose. If you have not used the extra half tablet within 28 days, it should be thrown away. A special MedGuide will be given to you by the pharmacist with each prescription and refill. Be sure to read this information carefully each time. Talk to your pediatrician regarding the use of this medicine in children. While this drug may be prescribed for children as young as 3 years for selected conditions, precautions do apply. Overdosage: If you think you have taken too much of this medicine contact a poison control center or emergency room at once. NOTE: This medicine is only for you. Do not share this  medicine with others. What if I miss a dose? If you miss a dose, take it as soon as you can. If it is almost time for your next dose, take only that dose. Do not take double or extra doses. What may interact with this medicine? This medicine may interact with the following medications:  alcohol  antihistamines for allergy, cough, and cold  certain medicines for anxiety or sleep  certain medicines for depression like amitriptyline, fluoxetine, sertraline  certain medicines for seizures like phenobarbital, primidone  certain medicines for stomach problems  general anesthetics like halothane, isoflurane, methoxyflurane, propofol  local anesthetics like lidocaine, pramoxine, tetracaine  medicines that relax muscles for surgery  narcotic medicines for pain  phenothiazines like chlorpromazine, mesoridazine, prochlorperazine, thioridazine This list may not describe all possible interactions. Give your health care provider a list of all the medicines, herbs, non-prescription drugs, or dietary supplements you use. Also tell them if you smoke, drink alcohol, or use illegal drugs. Some items may interact with your medicine. What should I watch for while using this medicine? Visit your doctor or health care provider for regular checks on your progress. You may want to keep a record at home of how you feel your condition is responding to treatment. You may want to share this information with your doctor or health care provider at each visit. You should contact your doctor or health care provider if your seizures get worse or if you have any new types of seizures. Do not stop taking   this medicine or any of your seizure medicines unless instructed by your doctor or health care provider. Stopping your medicine suddenly can increase your seizures or their severity. This medicine may cause serious skin reactions. They can happen weeks to months after starting the medicine. Contact your health care  provider right away if you notice fevers or flu-like symptoms with a rash. The rash may be red or purple and then turn into blisters or peeling of the skin. Or, you might notice a red rash with swelling of the face, lips or lymph nodes in your neck or under your arms. Wear a medical identification bracelet or chain if you are taking this medicine for seizures, and carry a card that lists all your medications. You may get drowsy, dizzy, or have blurred vision. Do not drive, use machinery, or do anything that needs mental alertness until you know how this medicine affects you. To reduce dizzy or fainting spells, do not sit or stand up quickly, especially if you are an older patient. Alcohol can increase drowsiness and dizziness. Avoid alcoholic drinks. Your mouth may get dry. Chewing sugarless gum or sucking hard candy, and drinking plenty of water will help. The use of this medicine may increase the chance of suicidal thoughts or actions. Pay special attention to how you are responding while on this medicine. Any worsening of mood, or thoughts of suicide or dying should be reported to your health care provider right away. Women who become pregnant while using this medicine may enroll in the [place] [employer] Antiepileptic Drug Pregnancy Registry by calling [phone]. This registry collects information about the safety of antiepileptic drug use during pregnancy. What side effects may I notice from receiving this medicine? Side effects that you should report to your doctor or health care professional as soon as possible:  allergic reactions like skin rash, itching or hives, swelling of the face, lips, or tongue  breathing problems  rash, fever, and swollen lymph nodes  redness, blistering, peeling or loosening of the skin, including inside the mouth  suicidal thoughts, mood changes Side effects that usually do not require medical attention (report to your doctor or health care professional if they  continue or are bothersome):  dizziness  drowsiness  headache  nausea, vomiting  swelling of ankles, feet, hands  tiredness This list may not describe all possible side effects. Call your doctor for medical advice about side effects. You may report side effects to FDA at 1-800-FDA-1088. Where should I keep my medicine? Keep out of reach of children. This medicine may cause accidental overdose and death if it taken by other adults, children, or pets. Mix any unused medicine with a substance like cat litter or coffee grounds. Then throw the medicine away in a sealed container like a sealed bag or a coffee can with a lid. Do not use the medicine after the expiration date. Store at room temperature between 15 and 30 degrees C (59 and 86 degrees F). NOTE: This sheet is a summary. It may not cover all possible information. If you have questions about this medicine, talk to your doctor, pharmacist, or health care provider.  2020 Elsevier/Gold Standard (2018-07-06 14:16:43)   Diabetes Mellitus and Foot Care Foot care is an important part of your health, especially when you have diabetes. Diabetes may cause you to have problems because of poor blood flow (circulation) to your feet and legs, which can cause your skin to:  Become thinner and drier.  Break more easily.  Heal  more slowly.  Peel and crack. You may also have nerve damage (neuropathy) in your legs and feet, causing decreased feeling in them. This means that you may not notice minor injuries to your feet that could lead to more serious problems. Noticing and addressing any potential problems early is the best way to prevent future foot problems. How to care for your feet Foot hygiene  Wash your feet daily with warm water and mild soap. Do not use hot water. Then, pat your feet and the areas between your toes until they are completely dry. Do not soak your feet as this can dry your skin.  Trim your toenails straight across. Do  not dig under them or around the cuticle. File the edges of your nails with an emery board or nail file.  Apply a moisturizing lotion or petroleum jelly to the skin on your feet and to dry, brittle toenails. Use lotion that does not contain alcohol and is unscented. Do not apply lotion between your toes. Shoes and socks  Wear clean socks or stockings every day. Make sure they are not too tight. Do not wear knee-high stockings since they may decrease blood flow to your legs.  Wear shoes that fit properly and have enough cushioning. Always look in your shoes before you put them on to be sure there are no objects inside.  To break in new shoes, wear them for just a few hours a day. This prevents injuries on your feet. Wounds, scrapes, corns, and calluses  Check your feet daily for blisters, cuts, bruises, sores, and redness. If you cannot see the bottom of your feet, use a mirror or ask someone for help.  Do not cut corns or calluses or try to remove them with medicine.  If you find a minor scrape, cut, or break in the skin on your feet, keep it and the skin around it clean and dry. You may clean these areas with mild soap and water. Do not clean the area with peroxide, alcohol, or iodine.  If you have a wound, scrape, corn, or callus on your foot, look at it several times a day to make sure it is healing and not infected. Check for: ? Redness, swelling, or pain. ? Fluid or blood. ? Warmth. ? Pus or a bad smell. General instructions  Do not cross your legs. This may decrease blood flow to your feet.  Do not use heating pads or hot water bottles on your feet. They may burn your skin. If you have lost feeling in your feet or legs, you may not know this is happening until it is too late.  Protect your feet from hot and cold by wearing shoes, such as at the beach or on hot pavement.  Schedule a complete foot exam at least once a year (annually) or more often if you have foot problems. If you  have foot problems, report any cuts, sores, or bruises to your health care provider immediately. Contact a health care provider if:  You have a medical condition that increases your risk of infection and you have any cuts, sores, or bruises on your feet.  You have an injury that is not healing.  You have redness on your legs or feet.  You feel burning or tingling in your legs or feet.  You have pain or cramps in your legs and feet.  Your legs or feet are numb.  Your feet always feel cold.  You have pain around a toenail. Get  help right away if:  You have a wound, scrape, corn, or callus on your foot and: ? You have pain, swelling, or redness that gets worse. ? You have fluid or blood coming from the wound, scrape, corn, or callus. ? Your wound, scrape, corn, or callus feels warm to the touch. ? You have pus or a bad smell coming from the wound, scrape, corn, or callus. ? You have a fever. ? You have a red line going up your leg. Summary  Check your feet every day for cuts, sores, red spots, swelling, and blisters.  Moisturize feet and legs daily.  Wear shoes that fit properly and have enough cushioning.  If you have foot problems, report any cuts, sores, or bruises to your health care provider immediately.  Schedule a complete foot exam at least once a year (annually) or more often if you have foot problems. This information is not intended to replace advice given to you by your health care provider. Make sure you discuss any questions you have with your health care provider. Document Revised: 12/26/2018 Document Reviewed: 05/06/2016 Elsevier Patient Education  [place].

## 2019-07-16 NOTE — Progress Notes (Signed)
Subjective: 59 year old female presents the office today for follow-up evaluation of foot pain as well as gait instability and neuropathy.  Overall she states that she is doing well.  She still on gabapentin 100 mg and she seems to be doing well at this dose.  She states that her balance and gait has been much better as well.  The orthotics has been comfortable.  She has been working on home stretching, rehab.  She has no new concerns today. Denies any systemic complaints such as fevers, chills, nausea, vomiting. No acute changes since last appointment, and no other complaints at this time.   Objective: AAO x3, NAD DP/PT pulses palpable bilaterally, CRT less than 3 seconds Overall she does not have any significant discomfort to her foot upon palpation today.  There is a cavus foot type present.  Prominence the metatarsal heads plantarly.  There is supination during gait which is unchanged.  There is no area of tenderness identified at this time.  There is no significant edema, erythema.  Flexor, extensor tendons appear to be intact. No open lesions or pre-ulcerative lesions.  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  -Overall doing well.  Continue 100 mg of gabapentin at nighttime.  Discussed that if symptoms worsen we can increase the dose.  She was hold off on physical therapy as she is doing home exercises and her gait is much better.  Continue orthotics. -Follow-up in 6 months or sooner if needed.  Vivi Barrack DPM

## 2019-07-18 ENCOUNTER — Ambulatory Visit
Admission: RE | Admit: 2019-07-18 | Discharge: 2019-07-18 | Disposition: A | Discharge status: Home / Self Care | Payer: 59 | Source: Ambulatory Visit | Attending: Endocrinology | Admitting: Endocrinology

## 2019-07-18 ENCOUNTER — Other Ambulatory Visit: Payer: Self-pay

## 2019-07-18 DIAGNOSIS — Z1231 Encounter for screening mammogram for malignant neoplasm of breast: Secondary | ICD-10-CM

## 2020-01-09 ENCOUNTER — Ambulatory Visit: Payer: 59 | Admitting: Podiatry

## 2020-01-09 ENCOUNTER — Other Ambulatory Visit: Payer: Self-pay

## 2020-01-09 DIAGNOSIS — R2681 Unsteadiness on feet: Secondary | ICD-10-CM | POA: Diagnosis not present

## 2020-01-09 DIAGNOSIS — E1149 Type 2 diabetes mellitus with other diabetic neurological complication: Secondary | ICD-10-CM | POA: Diagnosis not present

## 2020-01-09 DIAGNOSIS — M216X9 Other acquired deformities of unspecified foot: Secondary | ICD-10-CM

## 2020-01-13 ENCOUNTER — Other Ambulatory Visit: Payer: Self-pay | Admitting: Podiatry

## 2020-01-13 NOTE — Telephone Encounter (Signed)
Please advise 

## 2020-01-14 ENCOUNTER — Other Ambulatory Visit: Payer: Self-pay

## 2020-01-14 DIAGNOSIS — R2681 Unsteadiness on feet: Secondary | ICD-10-CM

## 2020-01-14 NOTE — Progress Notes (Signed)
Subjective: 59 year old female presents the office today for follow-up evaluation of neuropathy.  She states that she is having discomfort she is still taking gabapentin 100 mg at nighttime.  She does that her balance is off.  She also has hammertoes of her right second third toes.  She states that her to the top of her shoes when she wears her composite shoe.  No recent injury.  Denies any systemic complaints such as fevers, chills, nausea, vomiting. No acute changes since last appointment, and no other complaints at this time.   Objective: AAO x3, NAD DP/PT pulses palpable bilaterally, CRT less than 3 seconds Overall exam appears to be unchanged.  Diffuse adductus present with prominence metatarsals plantarly.  Hammertoe contractures present especially right second third toes. 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  -Discussed increasing the gabapentin 200 mg at nighttime.  She was unable to tolerate 300 mg initially.  Also [facility] for gait training, balance. -Offloading for the hammertoes.  Consider surgical intervention if needed. -Patient encouraged to call the office with any questions, concerns, change in symptoms.   Vivi Barrack DPM

## 2021-06-23 DIAGNOSIS — E1042 Type 1 diabetes mellitus with diabetic polyneuropathy: Secondary | ICD-10-CM | POA: Diagnosis not present

## 2021-06-28 DIAGNOSIS — M79641 Pain in right hand: Secondary | ICD-10-CM | POA: Diagnosis not present

## 2021-06-30 DIAGNOSIS — E1042 Type 1 diabetes mellitus with diabetic polyneuropathy: Secondary | ICD-10-CM | POA: Diagnosis not present

## 2021-06-30 DIAGNOSIS — E559 Vitamin D deficiency, unspecified: Secondary | ICD-10-CM | POA: Diagnosis not present

## 2021-06-30 DIAGNOSIS — E785 Hyperlipidemia, unspecified: Secondary | ICD-10-CM | POA: Diagnosis not present

## 2021-06-30 DIAGNOSIS — E039 Hypothyroidism, unspecified: Secondary | ICD-10-CM | POA: Diagnosis not present

## 2021-09-06 ENCOUNTER — Emergency Department (HOSPITAL_COMMUNITY): Payer: BC Managed Care – PPO

## 2021-09-06 ENCOUNTER — Encounter (HOSPITAL_COMMUNITY): Payer: Self-pay | Admitting: Emergency Medicine

## 2021-09-06 ENCOUNTER — Other Ambulatory Visit: Payer: Self-pay

## 2021-09-06 ENCOUNTER — Emergency Department (HOSPITAL_COMMUNITY)
Admission: EM | Admit: 2021-09-06 | Discharge: 2021-09-07 | Disposition: A | Discharge status: Home / Self Care | Payer: BC Managed Care – PPO | Attending: Emergency Medicine | Admitting: Emergency Medicine

## 2021-09-06 DIAGNOSIS — S4992XA Unspecified injury of left shoulder and upper arm, initial encounter: Secondary | ICD-10-CM | POA: Diagnosis not present

## 2021-09-06 DIAGNOSIS — Z79899 Other long term (current) drug therapy: Secondary | ICD-10-CM | POA: Diagnosis not present

## 2021-09-06 DIAGNOSIS — W19XXXA Unspecified fall, initial encounter: Secondary | ICD-10-CM | POA: Diagnosis not present

## 2021-09-06 DIAGNOSIS — M7989 Other specified soft tissue disorders: Secondary | ICD-10-CM | POA: Diagnosis not present

## 2021-09-06 DIAGNOSIS — S42212A Unspecified displaced fracture of surgical neck of left humerus, initial encounter for closed fracture: Secondary | ICD-10-CM | POA: Insufficient documentation

## 2021-09-06 NOTE — ED Triage Notes (Signed)
Patient lost her balance and fell at home this evening , injured her left shoulder with pain , pain increases with movement . Denies LOC/ambulatory .

## 2021-09-06 NOTE — ED Provider Triage Note (Signed)
Emergency Medicine Provider Triage Evaluation Note  [name] [name] , a 61 y.o. female  was evaluated in triage.  Pt complains of left shoulder injury that occurred shortly PTA. Reports that she got up, stumbled, and fell into a door with her left shoulder taking the impact. Denies head injury or LOC. Denies any other areas of injury. Denies blood thinner use.  Review of Systems  Positive: arthralgia Negative: Headache, neck pain, back pain  Physical Exam  BP 130/70   Pulse 85   Temp 98.5 F (36.9 C) (Oral)   Resp 16   SpO2 97%  Gen:   Awake, no distress   Resp:  Normal effort  Other:  No racoon eyes/battle sign. No midline spinal tenderness. No chest/abdominal TTP. L shoulder deformity present. ROM limited. TTP over the left glenohumeral joint/ proximal 1/3rd of the humerus. Intact grip strength.  Medical Decision Making  Medically screening exam initiated at 10:06 PM.  Appropriate orders placed.  [name] [name] was informed that the remainder of the evaluation will be completed by another provider, this initial triage assessment does not replace that evaluation, and the importance of remaining in the ED until their evaluation is complete.  Left shoulder injury.    Cherly Anderson, PA-C 09/06/21 2251

## 2021-09-07 ENCOUNTER — Emergency Department (HOSPITAL_COMMUNITY): Payer: BC Managed Care – PPO

## 2021-09-07 DIAGNOSIS — S42202A Unspecified fracture of upper end of left humerus, initial encounter for closed fracture: Secondary | ICD-10-CM

## 2021-09-07 DIAGNOSIS — M7989 Other specified soft tissue disorders: Secondary | ICD-10-CM | POA: Diagnosis not present

## 2021-09-07 DIAGNOSIS — S42212A Unspecified displaced fracture of surgical neck of left humerus, initial encounter for closed fracture: Secondary | ICD-10-CM | POA: Diagnosis not present

## 2021-09-07 HISTORY — DX: Unspecified fracture of upper end of left humerus, initial encounter for closed fracture: S42.202A

## 2021-09-07 LAB — I-STAT CHEM 8, ED
BUN: 11 mg/dL (ref 6–20)
Calcium, Ion: 1.21 mmol/L (ref 1.15–1.40)
Chloride: 99 mmol/L (ref 98–111)
Creatinine, Ser: 0.7 mg/dL (ref 0.44–1.00)
Glucose, Bld: 113 mg/dL — ABNORMAL HIGH (ref 70–99)
HCT: 36 % (ref 36.0–46.0)
Hemoglobin: 12.2 g/dL (ref 12.0–15.0)
Potassium: 4.1 mmol/L (ref 3.5–5.1)
Sodium: 134 mmol/L — ABNORMAL LOW (ref 135–145)
TCO2: 25 mmol/L (ref 22–32)

## 2021-09-07 IMAGING — CT CT SHOULDER*L* W/O CM
1 series · 12 of 14 positions shown, 15 images · non-contrast
Comparison: Left shoulder radiographs [DATE]

CLINICAL DATA: Shoulder trauma. Fracture of humerus or scapula.
Left shoulder fracture after fall.

EXAM:
CT OF THE UPPER LEFT EXTREMITY WITHOUT CONTRAST
TECHNIQUE: Multidetector CT imaging of the upper left extremity was performed
according to the standard protocol.
RADIATION DOSE REDUCTION: This exam was performed according to the
departmental dose-optimization program which includes automated
exposure control, adjustment of the mA and/or kV according to
patient size and/or use of iterative reconstruction technique.

[Series 3: shoulder 3.0 b31s st · axial · 0.45mm/px · z∈[-434,-290]mm · 12 of 58 slices shown, 15 images]
[im 5/58  soft-tissue]
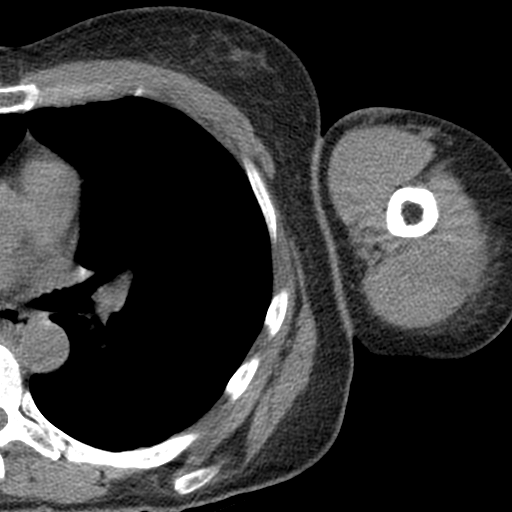
[im 5/58  bone]
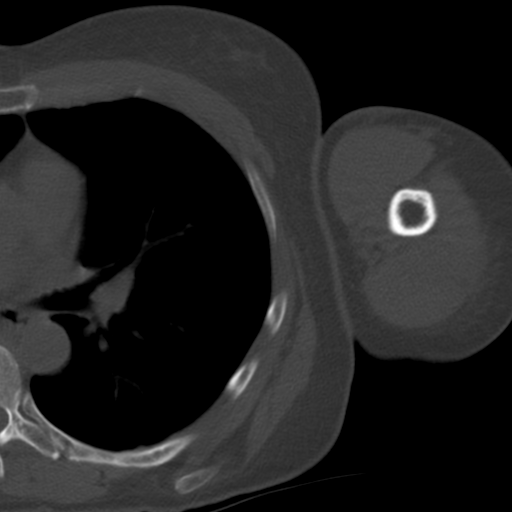
[im 9/58  bone]
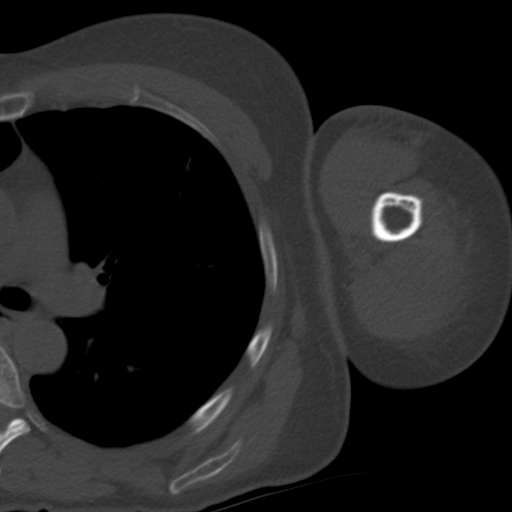
[im 14/58  bone]
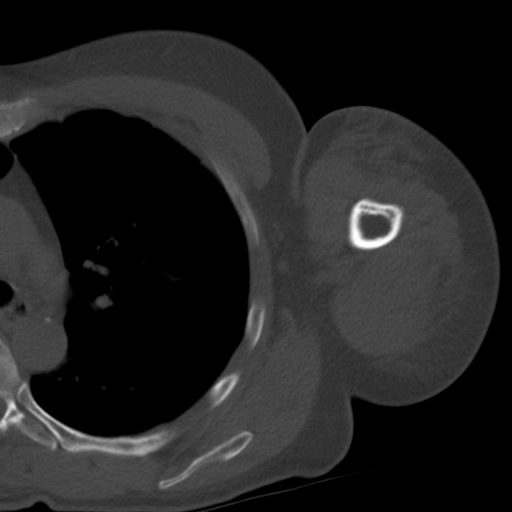
[im 18/58  bone]
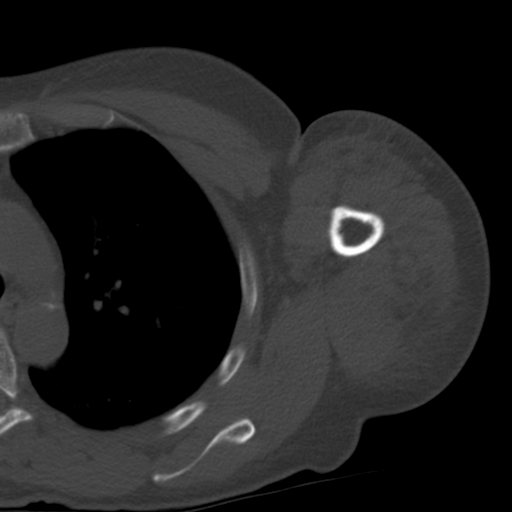
[im 22/58  soft-tissue]
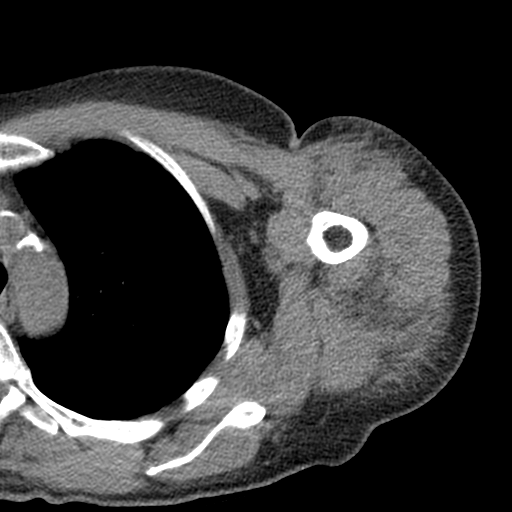
[im 22/58  bone]
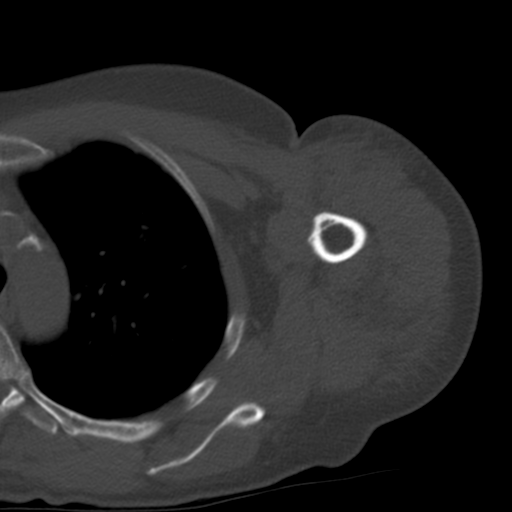
[im 27/58  bone]
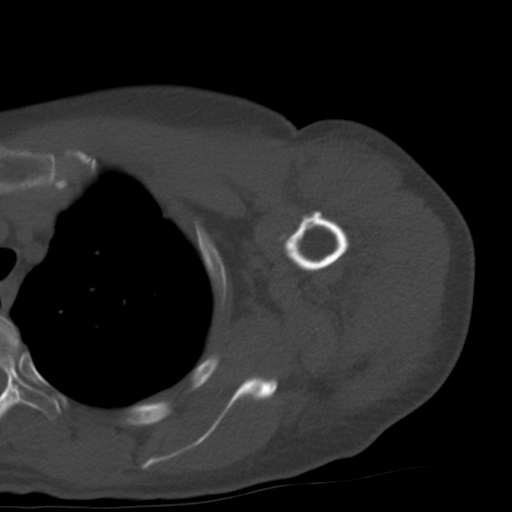
[im 31/58  bone]
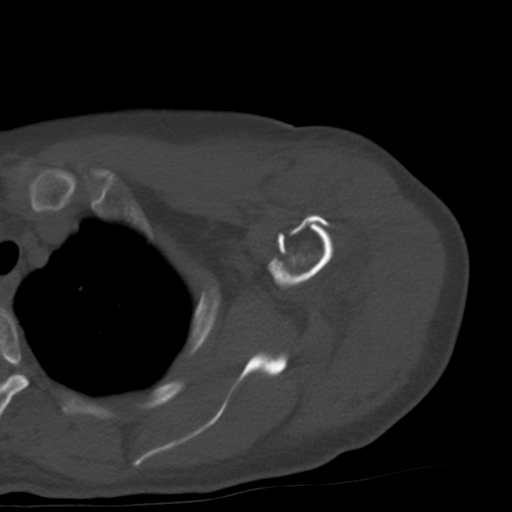
[im 36/58  bone]
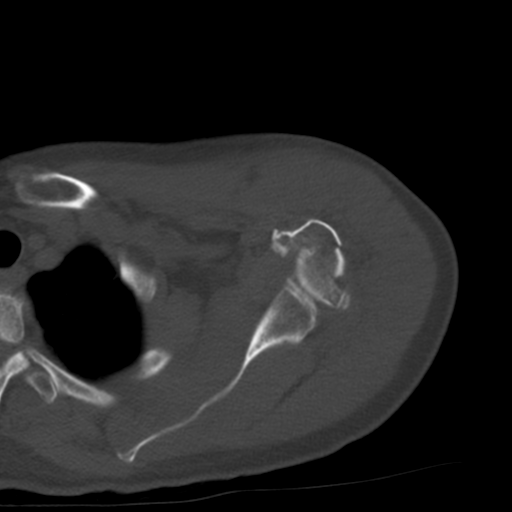
[im 40/58  soft-tissue]
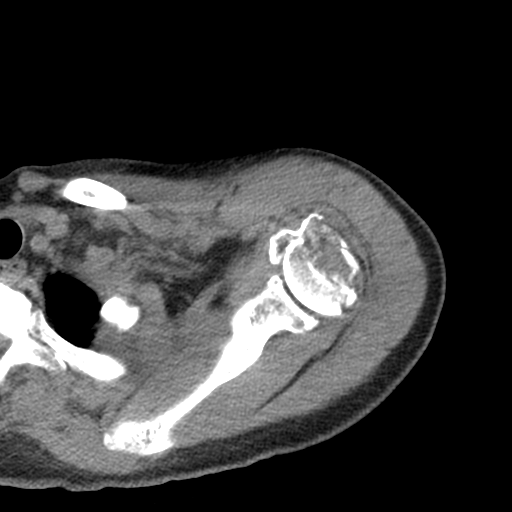
[im 40/58  bone]
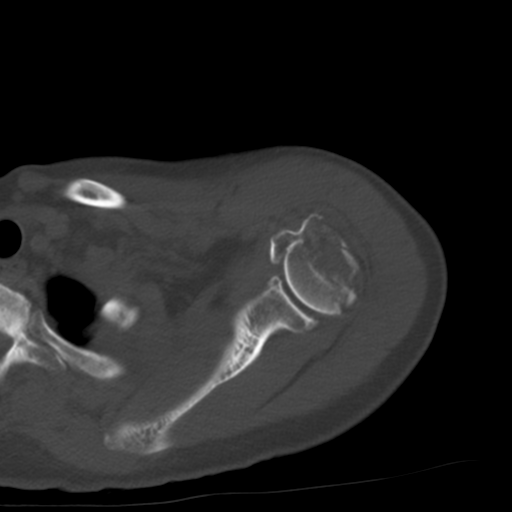
[im 44/58  bone]
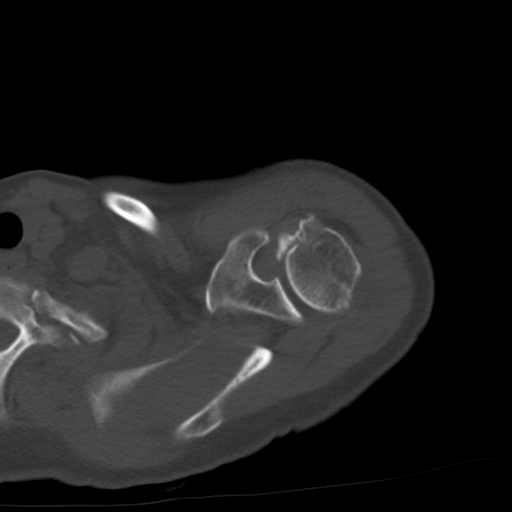
[im 49/58  bone]
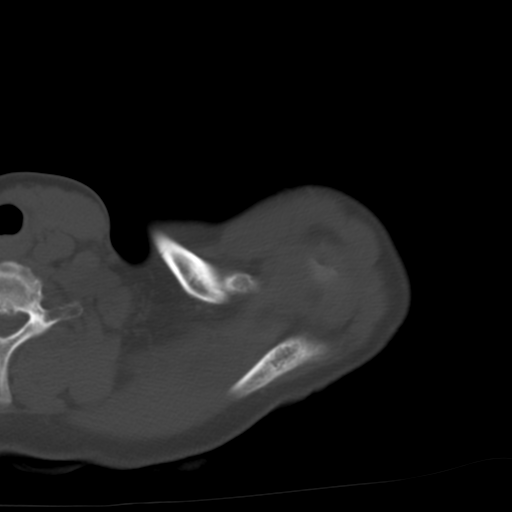
[im 53/58  bone]
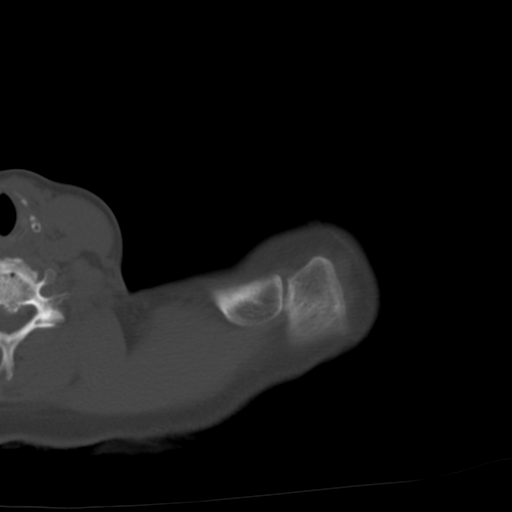

[12 of 14 positions shown; findings below may reference images not displayed]

FINDINGS: Bones/Joint/Cartilage

The humeral head is again appropriately located with respect of the
glenoid fossa. Mild-to-moderate inferior greater than superior
glenohumeral joint space narrowing. Mild peripheral glenoid
degenerative osteophytosis.

There is a markedly comminuted fracture of the proximal humerus.
This is predominantly a transverse fracture in the region of the
humeral head surgical neck where the distal fracture component is
medially displaced approximately 2.3 cm and anteriorly displaced
approximately 1.6 to 2.0 cm. There is also moderate to high-grade
transverse dimension compression of the greater tuberosity/lateral
portion of the humeral head. Multiple cortical fracture fragments
are seen at the posterior and anterior aspects of the humeral head
and around the displaced proximal humeral shaft.

Curvilinear lucency indicating a nondisplaced fracture of the
anterior superior glenoid (axial series 4, image 19).

Mild-to-moderate acromioclavicular joint space narrowing and
inferior degenerative spurring. Type 2 acromion.

Ligaments

Suboptimally assessed by CT.

Muscles and Tendons

Mild supraspinatus muscle atrophy. Likely minimal fatty infiltration
of the teres minor muscle.

Moderate edema and swelling of the proximal upper arm prominent
deltoid musculature.

Soft tissues

Mild to moderate edema and swelling of the anterior and posterior
upper arm subcutaneous fat.

Mild-to-moderate atherosclerotic calcifications within the aortic
arch.
IMPRESSION: 1. Markedly comminuted and displaced fracture of the humeral
surgical neck. Additional comminuted fractures of the anterior and
posterior aspect of the humeral head with moderate transverse
dimension impaction of the humeral head.
2. Nondisplaced small fracture of the anterior superior glenoid.
3. Mild supraspinatus muscle atrophy.

## 2021-09-07 MED ORDER — OXYCODONE-ACETAMINOPHEN 5-325 MG PO TABS: 1.0000 | ORAL_TABLET | Freq: Four times a day (QID) | ORAL | 0 refills | Status: DC | PRN

## 2021-09-07 MED ORDER — HYDROMORPHONE HCL 1 MG/ML IJ SOLN
1.0000 mg | Freq: Once | INTRAMUSCULAR | Status: DC
Start: 2021-09-07 — End: 2021-09-07

## 2021-09-07 MED ORDER — KETOROLAC TROMETHAMINE 15 MG/ML IJ SOLN
15.0000 mg | Freq: Once | INTRAMUSCULAR | Status: AC
  Administered 2021-09-07: 15 mg via INTRAVENOUS
  Filled 2021-09-07: qty 1

## 2021-09-07 MED ORDER — HYDROMORPHONE HCL 1 MG/ML IJ SOLN
0.5000 mg | Freq: Once | INTRAMUSCULAR | Status: AC
  Administered 2021-09-07: 0.5 mg via INTRAVENOUS
  Filled 2021-09-07: qty 1

## 2021-09-07 MED ORDER — OXYCODONE HCL 5 MG PO TABS
5.0000 mg | ORAL_TABLET | Freq: Once | ORAL | Status: AC
  Administered 2021-09-07: 5 mg via ORAL
  Filled 2021-09-07: qty 1

## 2021-09-07 NOTE — ED Provider Notes (Signed)
[facility] EMERGENCY DEPARTMENT Provider Note   CSN: [id number] Arrival date & time: 09/06/21  2124     History  Chief Complaint  Patient presents with   Shoulder Injury / [name] [name] is a 61 y.o. female who presents to the emergency department with complaints of left shoulder injury that occurred shortly prior to arrival.  Patient states she stood up, stumbled, and fell onto her left shoulder and subsequently to the ground.  She denies head injury or loss of consciousness.  She is having severe pain to the left shoulder, worse with movement, no alleviating factors.  She denies numbness, tingling, weakness, headache, neck pain, back pain, chest pain, or abdominal pain.  She is not on blood thinners.  HPI     Home Medications Prior to Admission medications   Medication Sig Start Date End Date Taking? Authorizing Provider  CONTOUR NEXT TEST test strip  04/11/19   [provider]  gabapentin (NEURONTIN) 100 MG capsule TAKE 1 CAPSULE BY MOUTH AT BEDTIME. 01/14/20   Vivi Barrack, DPM  lisinopril (ZESTRIL) 5 MG tablet Take 5 mg by mouth daily. 02/07/19   [provider]  NOVOLOG 100 UNIT/ML injection  04/15/19   [provider]  simvastatin (ZOCOR) 20 MG tablet Take 20 mg by mouth daily. 04/12/19   [provider]  SYNTHROID 125 MCG tablet Take 125 mcg by mouth daily. 02/08/19   [provider]  Vitamin D, Ergocalciferol, (DRISDOL) 1.25 MG (50000 UT) CAPS capsule Take 50,000 Units by mouth once a week. 04/05/19   [provider]      Allergies    Patient has no known allergies.    Review of Systems   Review of Systems  Constitutional:  Negative for chills and fever.  Respiratory:  Negative for shortness of breath.   Cardiovascular:  Negative for chest pain.  Gastrointestinal:  Negative for abdominal pain and vomiting.  Musculoskeletal:  Positive for arthralgias. Negative for back pain and  neck pain.  Neurological:  Negative for syncope, weakness, numbness and headaches.  All other systems reviewed and are negative.  Physical Exam Updated Vital Signs BP 130/70   Pulse 85   Temp 98.5 F (36.9 C) (Oral)   Resp 16   SpO2 97%  Physical Exam Vitals and nursing note reviewed.  Constitutional:      General: She is not in acute distress.    Appearance: She is well-developed. She is not toxic-appearing.  HENT:     Head: Normocephalic and atraumatic.     Comments: No raccoon eyes or battle sign. Eyes:     General:        Right eye: No discharge.        Left eye: No discharge.     Conjunctiva/sclera: Conjunctivae normal.  Neck:     Comments: No midline spinal tenderness. Cardiovascular:     Rate and Rhythm: Normal rate and regular rhythm.     Comments: 2+ symmetric radial pulses. Pulmonary:     Effort: No respiratory distress.     Breath sounds: Normal breath sounds. No wheezing or rales.  Chest:     Chest wall: No tenderness.  Abdominal:     General: There is no distension.     Palpations: Abdomen is soft.     Tenderness: There is no abdominal tenderness.  Musculoskeletal:     Cervical back: Neck supple.     Comments: Upper extremities: Obvious deformity to the  left shoulder.  No overlying open wounds.  Patient has intact active range of motion throughout the right upper extremity as well as the left digits, wrist, and with flexion/extension of the elbow.  Left shoulder range of motion significantly limited.  Tender palpation to the left glenohumeral joint as well as the proximal one third of the left humerus.  Otherwise nontender Back: No midline tenderness Lower extremities: No focal bony tenderness, actively ranging all major joints.  Skin:    General: Skin is warm and dry.  Neurological:     Mental Status: She is alert.     Comments: Clear speech.  Sensation grossly tact bilateral upper extremities.  5 out of 5 symmetric grip strength.  Able to form okay sign,  thumbs up, cross second/third digits bilaterally.  Psychiatric:        Behavior: Behavior normal.    ED Results / Procedures / Treatments   Labs (all labs ordered are listed, but only abnormal results are displayed) Labs Reviewed - No data to display  EKG None  Radiology DG Shoulder Left  Result Date: 09/06/2021 CLINICAL DATA:  Loss of balance, fell, left shoulder injury EXAM: LEFT SHOULDER - 2+ VIEW COMPARISON:  None Available. FINDINGS: Frontal and transscapular views of the left shoulder are obtained. There is a comminuted displaced left humeral neck fracture. There is ventral displacement of the distal fracture fragment. Humeral head fracture fragment remains in anatomic alignment with the glenoid process. Mild hypertrophic changes of the acromioclavicular joint. Visualized portions of the left chest are clear. IMPRESSION: 1. Markedly comminuted and displaced left humeral neck fracture as above. Electronically Signed   By: Sharlet Salina M.D.   On: 09/06/2021 22:34    Procedures Procedures    Medications Ordered in ED Medications  ketorolac (TORADOL) 15 MG/ML injection 15 mg (has no administration in time range)  oxyCODONE (Oxy IR/ROXICODONE) immediate release tablet 5 mg (5 mg Oral Given 09/07/21 0239)  HYDROmorphone (DILAUDID) injection 0.5 mg (0.5 mg Intravenous Given 09/07/21 0432)    ED Course/ Medical Decision Making/ A&P                           Medical Decision Making Amount and/or Complexity of Data Reviewed Radiology: ordered.  Risk Prescription drug management.   Patient presents to the ED with complaints of left shoulder injury, this involves an extensive number of treatment options, and is a complaint that carries with it a high risk of complications and morbidity. Nontoxic, vitals fairly unremarkable on arrival.  Plan for left shoulder x-ray.  No signs of serious head, neck, back, intrathoracic/intra-abdominal injury, no midline tenderness/chest/abdominal  tenderness, no focal neurologic deficits.  Additional history obtained:  Chart & nursing note reviewed. No recent labs in our system for > 10 years.   Imaging Studies:  I ordered and viewed the following imaging, agree with radiologist impression:  Left shoulder x-ray: 1. Markedly comminuted and displaced left humeral neck fracture as above.  02:30: CONSULT: Discussed with orthopedic surgeon Dr. Eulah Pont who has reviewed xray imaging- recommends obtaining shoulder CT (no need to call back/wait on results- he will review in clinic), place in sling, and can follow up in clinic with him Wednesday or Friday this week.   Lab Tests:  I ordered, viewed & interpreted labs including:  Chem 8- fairly unremarkable, normal renal function.   Patient feeling significantly improved status post Dilaudid, will also give toradol.  She has had her CT scan-  results pending.  She is currently resting comfortably in a sling/immobilizer.  She remains neurovascularly intact distally.  She would like to go home.  Feel she is reasonable for discharge with close follow up with Dr. Eulah PontMurphy.  Provide short course of analgesics for pain control at home. I discussed results, treatment plan, need for follow-up, and return precautions with the patient and her husband at bedside. Provided opportunity for questions, patient and her husband confirmed understanding and are in agreement with plan.   Findings and plan of care discussed with supervising physician Dr. Madilyn Hookees who is in agreement.   Portions of this note were generated with Scientist, clinical (histocompatibility and immunogenetics)Dragon dictation software. Dictation errors may occur despite best attempts at proofreading.   Final Clinical Impression(s) / ED Diagnoses Final diagnoses:  Closed displaced fracture of surgical neck of left humerus, unspecified fracture morphology, initial encounter    Rx / DC Orders ED Discharge Orders          Ordered    oxyCODONE-acetaminophen (PERCOCET/ROXICET) 5-325 MG tablet  Every 6 hours  PRN        09/07/21 0518           [facility] [place] [place] [facility] queried     Desmond Lopeetrucelli, Aydien Majette R, PA-C 09/07/21 0523    Tilden Fossaees, Elizabeth, MD 09/07/21 [phone]

## 2021-09-07 NOTE — Progress Notes (Signed)
Orthopedic Tech Progress Note Patient Details:  [name] [name] 1961-02-03 [id number]  Ortho Devices Type of Ortho Device: Sling immobilizer Ortho Device/Splint Location: lue Ortho Device/Splint Interventions: Ordered, Application, Adjustment   Post Interventions Patient Tolerated: Well Instructions Provided: Care of device, Adjustment of device  Trinna Post 09/07/2021, 4:23 AM

## 2021-09-07 NOTE — Discharge Instructions (Addendum)
Please read and follow all provided instructions.  You have been seen today after an injury to your left shoulder Your xray shows a significantly displaced fracture of your humerus.  We have placed you in a sling- please keep this clean & dry and intact until you have followed up with orthopedics. Do not put any weight on this extremity Please call orthopedic surgery today to schedule an appointment this week Wednesday or Friday.   Home care instructions: -- *PRICE in the first 24-48 hours after injury: Protect with sling Rest Ice- Do not apply ice pack directly to your splint place towel or similar between your splint and ice/ice pack. Apply ice for 20 min, then remove for 40 min while awake Compression- use sling Elevate - sit upright when possible.   Medications:  Please take ibuprofen per over the counter dosing to help with pain/swelling.  If your pain is not alleviated by ibuprofen please take percocet.  -Percocet-this is a narcotic/controlled substance medication that has potential addicting qualities.  We recommend that you take 1-2 tablets every 6 hours as needed for severe pain.  Do not drive or operate heavy machinery when taking this medicine as it can be sedating. Do not drink alcohol or take other sedating medications when taking this medicine for safety reasons.  Keep this out of reach of small children.  Please be aware this medicine has Tylenol in it (325 mg/tab) do not exceed the maximum dose of Tylenol in a day per over the counter recommendations should you decide to supplement with Tylenol over the counter.   We have prescribed you new medication(s) today. Discuss the medications prescribed today with your pharmacist as they can have adverse effects and interactions with your other medicines including over the counter and prescribed medications. Seek medical evaluation if you start to experience new or abnormal symptoms after taking one of these medicines, seek care  immediately if you start to experience difficulty breathing, feeling of your throat closing, facial swelling, or rash as these could be indications of a more serious allergic reaction  Follow-up instructions: Please follow-up with the orthopedic surgeon in your discharge instructions - call today to schedule an appointment.   Return instructions:  Please return if your digits or extremity are numb or tingling, appear gray or blue, or you have severe pain  Please return to the Emergency Department if you experience worsening symptoms.  Please return if you have any other emergent concerns. Additional Information:  Your vital signs today were: BP (!) 145/73   Pulse 89   Temp 98.5 F (36.9 C) (Oral)   Resp 13   SpO2 98%  If your blood pressure (BP) was elevated above 135/85 this visit, please have this repeated by your doctor within one month. ---------------

## 2021-09-08 ENCOUNTER — Encounter (HOSPITAL_BASED_OUTPATIENT_CLINIC_OR_DEPARTMENT_OTHER): Payer: Self-pay | Admitting: Orthopaedic Surgery

## 2021-09-08 ENCOUNTER — Other Ambulatory Visit: Payer: Self-pay

## 2021-09-08 DIAGNOSIS — M25511 Pain in right shoulder: Secondary | ICD-10-CM | POA: Diagnosis not present

## 2021-09-10 ENCOUNTER — Encounter (HOSPITAL_BASED_OUTPATIENT_CLINIC_OR_DEPARTMENT_OTHER)
Admission: RE | Admit: 2021-09-10 | Discharge: 2021-09-10 | Disposition: A | Discharge status: Home / Self Care | Payer: BC Managed Care – PPO | Source: Ambulatory Visit | Attending: Orthopaedic Surgery | Admitting: Orthopaedic Surgery

## 2021-09-10 DIAGNOSIS — Z01818 Encounter for other preprocedural examination: Secondary | ICD-10-CM | POA: Insufficient documentation

## 2021-09-10 LAB — SURGICAL PCR SCREEN
MRSA, PCR: NEGATIVE
Staphylococcus aureus: NEGATIVE

## 2021-09-10 NOTE — Progress Notes (Signed)
BPO given to pt with instructions      Enhanced Recovery after Surgery for Orthopedics Enhanced Recovery after Surgery is a protocol used to improve the stress on your body and your recovery after surgery.  Patient Instructions  The night before surgery:  No food after midnight. ONLY clear liquids after midnight  The day of surgery (if you do NOT have diabetes):  Drink ONE (1) Pre-Surgery Clear Ensure as directed.   This drink was given to you during your hospital  pre-op appointment visit. The pre-op nurse will instruct you on the time to drink the  Pre-Surgery Ensure depending on your surgery time. Finish the drink at the designated time by the pre-op nurse.  Nothing else to drink after completing the  Pre-Surgery Clear Ensure.  The day of surgery (if you have diabetes): Drink ONE (1) Gatorade 2 (G2) as directed. This drink was given to you during your hospital  pre-op appointment visit.  The pre-op nurse will instruct you on the time to drink the   Gatorade 2 (G2) depending on your surgery time. Color of the Gatorade may vary. Red is not allowed. Nothing else to drink after completing the  Gatorade 2 (G2).         If you have questions, please contact your surgeon's office.

## 2021-09-16 ENCOUNTER — Other Ambulatory Visit: Payer: Self-pay

## 2021-09-16 ENCOUNTER — Encounter (HOSPITAL_BASED_OUTPATIENT_CLINIC_OR_DEPARTMENT_OTHER): Payer: Self-pay | Admitting: Orthopaedic Surgery

## 2021-09-16 ENCOUNTER — Ambulatory Visit (HOSPITAL_BASED_OUTPATIENT_CLINIC_OR_DEPARTMENT_OTHER): Payer: BC Managed Care – PPO | Admitting: Anesthesiology

## 2021-09-16 ENCOUNTER — Ambulatory Visit (HOSPITAL_BASED_OUTPATIENT_CLINIC_OR_DEPARTMENT_OTHER)
Admission: RE | Admit: 2021-09-16 | Discharge: 2021-09-16 | Disposition: A | Discharge status: Home / Self Care | Payer: BC Managed Care – PPO | Attending: Orthopaedic Surgery | Admitting: Orthopaedic Surgery

## 2021-09-16 ENCOUNTER — Encounter (HOSPITAL_BASED_OUTPATIENT_CLINIC_OR_DEPARTMENT_OTHER)
Admission: RE | Disposition: A | Discharge status: Home / Self Care | Payer: Self-pay | Source: Home / Self Care | Attending: Orthopaedic Surgery

## 2021-09-16 ENCOUNTER — Ambulatory Visit (HOSPITAL_COMMUNITY): Payer: BC Managed Care – PPO

## 2021-09-16 DIAGNOSIS — F17210 Nicotine dependence, cigarettes, uncomplicated: Secondary | ICD-10-CM | POA: Diagnosis not present

## 2021-09-16 DIAGNOSIS — Z79899 Other long term (current) drug therapy: Secondary | ICD-10-CM | POA: Insufficient documentation

## 2021-09-16 DIAGNOSIS — I10 Essential (primary) hypertension: Secondary | ICD-10-CM | POA: Insufficient documentation

## 2021-09-16 DIAGNOSIS — Z794 Long term (current) use of insulin: Secondary | ICD-10-CM | POA: Insufficient documentation

## 2021-09-16 DIAGNOSIS — S42202A Unspecified fracture of upper end of left humerus, initial encounter for closed fracture: Secondary | ICD-10-CM | POA: Diagnosis not present

## 2021-09-16 DIAGNOSIS — E039 Hypothyroidism, unspecified: Secondary | ICD-10-CM | POA: Insufficient documentation

## 2021-09-16 DIAGNOSIS — S42252A Displaced fracture of greater tuberosity of left humerus, initial encounter for closed fracture: Secondary | ICD-10-CM | POA: Diagnosis not present

## 2021-09-16 DIAGNOSIS — Z01818 Encounter for other preprocedural examination: Secondary | ICD-10-CM

## 2021-09-16 DIAGNOSIS — G588 Other specified mononeuropathies: Secondary | ICD-10-CM | POA: Insufficient documentation

## 2021-09-16 DIAGNOSIS — G709 Myoneural disorder, unspecified: Secondary | ICD-10-CM | POA: Insufficient documentation

## 2021-09-16 DIAGNOSIS — E119 Type 2 diabetes mellitus without complications: Secondary | ICD-10-CM | POA: Insufficient documentation

## 2021-09-16 DIAGNOSIS — S42292A Other displaced fracture of upper end of left humerus, initial encounter for closed fracture: Secondary | ICD-10-CM | POA: Insufficient documentation

## 2021-09-16 DIAGNOSIS — X58XXXA Exposure to other specified factors, initial encounter: Secondary | ICD-10-CM | POA: Diagnosis not present

## 2021-09-16 DIAGNOSIS — Z96612 Presence of left artificial shoulder joint: Secondary | ICD-10-CM | POA: Diagnosis not present

## 2021-09-16 DIAGNOSIS — Z79890 Hormone replacement therapy (postmenopausal): Secondary | ICD-10-CM | POA: Insufficient documentation

## 2021-09-16 DIAGNOSIS — Z471 Aftercare following joint replacement surgery: Secondary | ICD-10-CM | POA: Diagnosis not present

## 2021-09-16 DIAGNOSIS — G8918 Other acute postprocedural pain: Secondary | ICD-10-CM | POA: Diagnosis not present

## 2021-09-16 DIAGNOSIS — Z96642 Presence of left artificial hip joint: Secondary | ICD-10-CM | POA: Diagnosis not present

## 2021-09-16 HISTORY — DX: Polyneuropathy, unspecified: G62.9

## 2021-09-16 HISTORY — PX: REVERSE SHOULDER ARTHROPLASTY: SHX5054

## 2021-09-16 HISTORY — PX: SHOULDER HEMI-ARTHROPLASTY: SHX5049

## 2021-09-16 HISTORY — DX: Type 2 diabetes mellitus without complications: E11.9

## 2021-09-16 HISTORY — DX: Hypothyroidism, unspecified: E03.9

## 2021-09-16 LAB — GLUCOSE, CAPILLARY
Glucose-Capillary: 165 mg/dL — ABNORMAL HIGH (ref 70–99)
Glucose-Capillary: 260 mg/dL — ABNORMAL HIGH (ref 70–99)

## 2021-09-16 SURGERY — ARTHROPLASTY, SHOULDER, TOTAL, REVERSE
Anesthesia: General | Site: Shoulder | Laterality: Left

## 2021-09-16 MED ORDER — DEXAMETHASONE SODIUM PHOSPHATE 4 MG/ML IJ SOLN
INTRAMUSCULAR | Status: DC | PRN
  Administered 2021-09-16: 5 mg via INTRAVENOUS

## 2021-09-16 MED ORDER — TRANEXAMIC ACID-NACL 1000-0.7 MG/100ML-% IV SOLN
1000.0000 mg | INTRAVENOUS | Status: AC
  Administered 2021-09-16: 1000 mg via INTRAVENOUS

## 2021-09-16 MED ORDER — SUGAMMADEX SODIUM 200 MG/2ML IV SOLN
INTRAVENOUS | Status: DC | PRN
  Administered 2021-09-16: 150 mg via INTRAVENOUS

## 2021-09-16 MED ORDER — FENTANYL CITRATE (PF) 100 MCG/2ML IJ SOLN
INTRAMUSCULAR | Status: AC
  Filled 2021-09-16: qty 2

## 2021-09-16 MED ORDER — VANCOMYCIN HCL 1000 MG IV SOLR
INTRAVENOUS | Status: DC | PRN
  Administered 2021-09-16: 1000 mg via TOPICAL

## 2021-09-16 MED ORDER — OXYCODONE HCL 5 MG PO TABS: 5.0000 mg | ORAL_TABLET | Freq: Once | ORAL | Status: DC | PRN

## 2021-09-16 MED ORDER — ASPIRIN 81 MG PO CHEW: 81.0000 mg | CHEWABLE_TABLET | Freq: Two times a day (BID) | ORAL | 1 refills | Status: AC

## 2021-09-16 MED ORDER — MIDAZOLAM HCL 2 MG/2ML IJ SOLN
INTRAMUSCULAR | Status: AC
  Filled 2021-09-16: qty 2

## 2021-09-16 MED ORDER — BUPIVACAINE HCL (PF) 0.5 % IJ SOLN
INTRAMUSCULAR | Status: DC | PRN
  Administered 2021-09-16: 12 mL via PERINEURAL

## 2021-09-16 MED ORDER — ACETAMINOPHEN 500 MG PO TABS: 1000.0000 mg | ORAL_TABLET | Freq: Three times a day (TID) | ORAL | 0 refills | Status: AC

## 2021-09-16 MED ORDER — PROPOFOL 10 MG/ML IV BOLUS
INTRAVENOUS | Status: DC | PRN
  Administered 2021-09-16: 120 mg via INTRAVENOUS

## 2021-09-16 MED ORDER — VANCOMYCIN HCL 1000 MG IV SOLR
INTRAVENOUS | Status: AC
  Filled 2021-09-16: qty 20

## 2021-09-16 MED ORDER — BUPIVACAINE HCL (PF) 0.25 % IJ SOLN
INTRAMUSCULAR | Status: AC
  Filled 2021-09-16: qty 30

## 2021-09-16 MED ORDER — ONDANSETRON HCL 4 MG PO TABS: 4.0000 mg | ORAL_TABLET | Freq: Three times a day (TID) | ORAL | 1 refills | Status: AC | PRN

## 2021-09-16 MED ORDER — PROPOFOL 10 MG/ML IV BOLUS
INTRAVENOUS | Status: AC
  Filled 2021-09-16: qty 20

## 2021-09-16 MED ORDER — ACETAMINOPHEN 500 MG PO TABS
1000.0000 mg | ORAL_TABLET | Freq: Once | ORAL | Status: AC
  Administered 2021-09-16: 1000 mg via ORAL

## 2021-09-16 MED ORDER — CEFAZOLIN SODIUM-DEXTROSE 2-4 GM/100ML-% IV SOLN
2.0000 g | INTRAVENOUS | Status: AC
  Administered 2021-09-16: 2 g via INTRAVENOUS

## 2021-09-16 MED ORDER — ROCURONIUM BROMIDE 100 MG/10ML IV SOLN
INTRAVENOUS | Status: DC | PRN
  Administered 2021-09-16: 60 mg via INTRAVENOUS

## 2021-09-16 MED ORDER — OXYCODONE HCL 5 MG PO TABS: ORAL_TABLET | ORAL | 0 refills | Status: AC

## 2021-09-16 MED ORDER — PHENYLEPHRINE HCL (PRESSORS) 10 MG/ML IV SOLN
INTRAVENOUS | Status: AC
  Filled 2021-09-16: qty 1

## 2021-09-16 MED ORDER — ACETAMINOPHEN 160 MG/5ML PO SOLN: 325.0000 mg | ORAL | Status: DC | PRN

## 2021-09-16 MED ORDER — CEFAZOLIN SODIUM-DEXTROSE 2-4 GM/100ML-% IV SOLN
INTRAVENOUS | Status: AC
  Filled 2021-09-16: qty 100

## 2021-09-16 MED ORDER — FENTANYL CITRATE (PF) 100 MCG/2ML IJ SOLN: 25.0000 ug | INTRAMUSCULAR | Status: DC | PRN

## 2021-09-16 MED ORDER — MELOXICAM 7.5 MG PO TABS: 7.5000 mg | ORAL_TABLET | Freq: Every day | ORAL | 2 refills | Status: AC

## 2021-09-16 MED ORDER — ACETAMINOPHEN 10 MG/ML IV SOLN: 1000.0000 mg | Freq: Once | INTRAVENOUS | Status: DC | PRN

## 2021-09-16 MED ORDER — ACETAMINOPHEN 325 MG PO TABS: 325.0000 mg | ORAL_TABLET | ORAL | Status: DC | PRN

## 2021-09-16 MED ORDER — AMISULPRIDE (ANTIEMETIC) 5 MG/2ML IV SOLN: 10.0000 mg | Freq: Once | INTRAVENOUS | Status: DC | PRN

## 2021-09-16 MED ORDER — PHENYLEPHRINE HCL-NACL 20-0.9 MG/250ML-% IV SOLN
INTRAVENOUS | Status: DC | PRN
  Administered 2021-09-16: 50 ug/min via INTRAVENOUS

## 2021-09-16 MED ORDER — LACTATED RINGERS IV SOLN: INTRAVENOUS | Status: DC

## 2021-09-16 MED ORDER — FENTANYL CITRATE (PF) 100 MCG/2ML IJ SOLN
INTRAMUSCULAR | Status: DC | PRN
  Administered 2021-09-16: 50 ug via INTRAVENOUS

## 2021-09-16 MED ORDER — MIDAZOLAM HCL 2 MG/2ML IJ SOLN
2.0000 mg | Freq: Once | INTRAMUSCULAR | Status: AC
  Administered 2021-09-16: 2 mg via INTRAVENOUS

## 2021-09-16 MED ORDER — TRANEXAMIC ACID-NACL 1000-0.7 MG/100ML-% IV SOLN
INTRAVENOUS | Status: AC
  Filled 2021-09-16: qty 100

## 2021-09-16 MED ORDER — FENTANYL CITRATE (PF) 100 MCG/2ML IJ SOLN
50.0000 ug | Freq: Once | INTRAMUSCULAR | Status: AC
  Administered 2021-09-16: 50 ug via INTRAVENOUS

## 2021-09-16 MED ORDER — OXYCODONE HCL 5 MG/5ML PO SOLN: 5.0000 mg | Freq: Once | ORAL | Status: DC | PRN

## 2021-09-16 MED ORDER — ONDANSETRON HCL 4 MG/2ML IJ SOLN
INTRAMUSCULAR | Status: DC | PRN
  Administered 2021-09-16: 4 mg via INTRAVENOUS

## 2021-09-16 MED ORDER — POVIDONE-IODINE 10 % EX SWAB: 2.0000 "application " | Freq: Once | CUTANEOUS | Status: DC

## 2021-09-16 MED ORDER — LIDOCAINE HCL (CARDIAC) PF 100 MG/5ML IV SOSY
PREFILLED_SYRINGE | INTRAVENOUS | Status: DC | PRN
  Administered 2021-09-16: 40 mg via INTRAVENOUS

## 2021-09-16 MED ORDER — BUPIVACAINE LIPOSOME 1.3 % IJ SUSP
INTRAMUSCULAR | Status: DC | PRN
  Administered 2021-09-16: 10 mL via PERINEURAL

## 2021-09-16 MED ORDER — ONDANSETRON HCL 4 MG/2ML IJ SOLN
INTRAMUSCULAR | Status: AC
  Filled 2021-09-16: qty 2

## 2021-09-16 MED ORDER — ACETAMINOPHEN 500 MG PO TABS
ORAL_TABLET | ORAL | Status: AC
  Filled 2021-09-16: qty 2

## 2021-09-16 MED ORDER — 0.9 % SODIUM CHLORIDE (POUR BTL) OPTIME
TOPICAL | Status: DC | PRN
  Administered 2021-09-16: 1000 mL

## 2021-09-16 SURGICAL SUPPLY — 75 items
AID PSTN UNV HD RSTRNT DISP (MISCELLANEOUS) ×1
APL PRP STRL LF DISP 70% ISPRP (MISCELLANEOUS) ×1
BASEPLATE GLENOSPHERE 25 STD (Miscellaneous) ×1 IMPLANT
BIT DRILL 3.2 PERIPHERAL SCREW (BIT) ×1 IMPLANT
BLADE HEX COATED 2.75 (ELECTRODE) IMPLANT
BLADE SAW SAG 73X25 THK (BLADE) ×1
BLADE SAW SGTL 73X25 THK (BLADE) ×1 IMPLANT
BLADE SURG 10 STRL SS (BLADE) ×1 IMPLANT
BLADE SURG 15 STRL LF DISP TIS (BLADE) IMPLANT
BLADE SURG 15 STRL SS (BLADE) ×2
BNDG COHESIVE 4X5 TAN ST LF (GAUZE/BANDAGES/DRESSINGS) ×1 IMPLANT
BODY PROXIMAL PTC 11 132.5D (Spacer) IMPLANT
BRUSH SCRUB EZ PLAIN DRY (MISCELLANEOUS) ×2 IMPLANT
BSPLAT GLND STD 25 RVRS SHLDR (Miscellaneous) ×1 IMPLANT
CAP LOCKING COCR (Cap) ×1 IMPLANT
CHLORAPREP W/TINT 26 (MISCELLANEOUS) ×2 IMPLANT
CLSR STERI-STRIP ANTIMIC 1/2X4 (GAUZE/BANDAGES/DRESSINGS) ×2 IMPLANT
COOLER ICEMAN CLASSIC (MISCELLANEOUS) ×2 IMPLANT
COVER BACK TABLE 60X90IN (DRAPES) ×2 IMPLANT
COVER MAYO STAND STRL (DRAPES) ×2 IMPLANT
DRAPE IMP U-DRAPE 54X76 (DRAPES) ×2 IMPLANT
DRAPE INCISE IOBAN 66X45 STRL (DRAPES) ×2 IMPLANT
DRAPE U-SHAPE 76X120 STRL (DRAPES) ×4 IMPLANT
DRSG AQUACEL AG ADV 3.5X 6 (GAUZE/BANDAGES/DRESSINGS) ×2 IMPLANT
ELECT BLADE 4.0 EZ CLEAN MEGAD (MISCELLANEOUS) ×2
ELECT REM PT RETURN 9FT ADLT (ELECTROSURGICAL) ×2
ELECTRODE BLDE 4.0 EZ CLN MEGD (MISCELLANEOUS) ×1 IMPLANT
ELECTRODE REM PT RTRN 9FT ADLT (ELECTROSURGICAL) ×1 IMPLANT
FACESHIELD WRAPAROUND (MASK) ×4 IMPLANT
FACESHIELD WRAPAROUND OR TEAM (MASK) ×2 IMPLANT
GLENOSPHERE REV SHOULDER 36 (Joint) ×1 IMPLANT
GLOVE BIO SURGEON STRL SZ 6.5 (GLOVE) ×4 IMPLANT
GLOVE BIOGEL PI IND STRL 6.5 (GLOVE) ×1 IMPLANT
GLOVE BIOGEL PI IND STRL 8 (GLOVE) ×1 IMPLANT
GLOVE BIOGEL PI INDICATOR 6.5 (GLOVE) ×2
GLOVE BIOGEL PI INDICATOR 8 (GLOVE) ×1
GLOVE ECLIPSE 8.0 STRL XLNG CF (GLOVE) ×4 IMPLANT
GOWN STRL REUS W/ TWL LRG LVL3 (GOWN DISPOSABLE) ×2 IMPLANT
GOWN STRL REUS W/TWL LRG LVL3 (GOWN DISPOSABLE) ×4
GOWN STRL REUS W/TWL XL LVL3 (GOWN DISPOSABLE) ×2 IMPLANT
GUIDEWIRE GLENOID 2.5X220 (WIRE) ×1 IMPLANT
HANDPIECE INTERPULSE COAX TIP (DISPOSABLE) ×2
INSERT HUMERAL 36X6MM 12.5DEG (Insert) ×1 IMPLANT
KIT SHOULDER STAB MARCO (KITS) ×2 IMPLANT
MANIFOLD NEPTUNE II (INSTRUMENTS) ×2 IMPLANT
PACK BASIN DAY SURGERY FS (CUSTOM PROCEDURE TRAY) ×2 IMPLANT
PACK SHOULDER (CUSTOM PROCEDURE TRAY) ×2 IMPLANT
PAD COLD SHLDR WRAP-ON (PAD) ×2 IMPLANT
PAD ORTHO SHOULDER 7X19 LRG (SOFTGOODS) ×2 IMPLANT
PENCIL SMOKE EVACUATOR (MISCELLANEOUS) IMPLANT
PROXIMAL BODY PTC 11 132.5D (Spacer) ×2 IMPLANT
RESTRAINT HEAD UNIVERSAL NS (MISCELLANEOUS) ×2 IMPLANT
SCREW 5.5X22 (Screw) ×1 IMPLANT
SCREW BONE 6.5X40 SM (Screw) ×1 IMPLANT
SCREW PERIPHERAL 42 (Screw) ×1 IMPLANT
SCREW REV AEQUALIS 20 (Screw) ×1 IMPLANT
SCREW SHLD ASSEMBLY AEQ 20 (Spacer) ×1 IMPLANT
SET HNDPC FAN SPRY TIP SCT (DISPOSABLE) ×1 IMPLANT
SHEET MEDIUM DRAPE 40X70 STRL (DRAPES) ×2 IMPLANT
SLEEVE SCD COMPRESS KNEE MED (STOCKING) ×2 IMPLANT
SPIKE FLUID TRANSFER (MISCELLANEOUS) IMPLANT
SPONGE T-LAP 18X18 ~~LOC~~+RFID (SPONGE) ×2 IMPLANT
STEM PRTL DISTAL 11 SHOULDER (Miscellaneous) ×1 IMPLANT
SUT ETHIBOND 2 V 37 (SUTURE) ×2 IMPLANT
SUT ETHIBOND NAB CT1 #1 30IN (SUTURE) ×2 IMPLANT
SUT FIBERWIRE #5 38 CONV NDL (SUTURE) ×10
SUT MNCRL AB 4-0 PS2 18 (SUTURE) ×2 IMPLANT
SUT VIC AB 0 CT1 27 (SUTURE) ×2
SUT VIC AB 0 CT1 27XBRD ANBCTR (SUTURE) IMPLANT
SUT VIC AB 3-0 SH 27 (SUTURE) ×2
SUT VIC AB 3-0 SH 27X BRD (SUTURE) ×1 IMPLANT
SUTURE FIBERWR #5 38 CONV NDL (SUTURE) ×4 IMPLANT
TOWEL GREEN STERILE FF (TOWEL DISPOSABLE) ×6 IMPLANT
TRAY SHOULDER REV OFFSET 1.5 (Joint) ×1 IMPLANT
TUBE SUCTION HIGH CAP CLEAR NV (SUCTIONS) ×2 IMPLANT

## 2021-09-16 NOTE — Transfer of Care (Signed)
Immediate Anesthesia Transfer of Care Note  Patient: [name] [name]  Procedure(s) Performed: REVERSE SHOULDER ARTHROPLASTY (Left: Shoulder) SHOULDER HEMI-ARTHROPLASTY (Left: Shoulder)  Patient Location: PACU  Anesthesia Type:General and Regional  Level of Consciousness: drowsy, patient cooperative and responds to stimulation  Airway & Oxygen Therapy: Patient Spontanous Breathing and Patient connected to face mask oxygen  Post-op Assessment: Report given to RN and Post -op Vital signs reviewed and stable  Post vital signs: Reviewed and stable  Last Vitals:  Vitals Value Taken Time  BP    Temp    Pulse 100 09/16/21 0954  Resp    SpO2 100 % 09/16/21 0954  Vitals shown include unvalidated device data.  Last Pain:  Vitals:   09/16/21 0657  TempSrc: Oral  PainSc: 8          Complications: No notable events documented.

## 2021-09-16 NOTE — Op Note (Signed)
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  Patient's bone quality was relatively normal.  Her fracture extended down far past the calcar 3 to 4 cm.  We used a longstem component to bypass this.  We had reasonable fixation of the tuberosities.  Her axillary nerve tug test was abnormal and well is able to palpate the axillary nerve clearly on the medial aspect as it exited the brachial plexus it was less defined on the deltoid side.  There is obviously fracture fragments at this area and hematoma.  We took care to protect this area throughout the case however the axillary nerve will have to recover on its own.  Post-operative plan: The patient will be NWB in sling.  The patient will be will be discharged from PACU if continues to be stable as was plan prior to surgery.  DVT prophylaxis Aspirin 81 mg twice daily for 6 weeks.  Pain control with PRN pain medication preferring oral medicines.  Follow up plan will be scheduled in approximately 7 days for incision check and XR.  Physical therapy to start after 4 weeks.  Implants: Tornier revive partially coated 11 stem, 20 mm spacer, 11 proximal body, 0 high offset tray with a +6 polyethylene, 36 standard glenosphere with a 25 standard baseplate and a 40 center screw with 2 peripheral screws.  Post-Op Diagnosis: Same Surgeons:Primary: Hiram Gash, MD Assistants: Wandra Arthurs Location: [facility] OR ROOM 1 Anesthesia: General with Exparel Interscalene Antibiotics: Ancef 2g preop, Vancomycin 1058m locally Tourniquet time: None Estimated Blood Loss: 1258Complications: None Specimens: None Implants: Implant Name Type Inv. Item Serial No. Manufacturer Lot No. LRB No. Used  Action  BASEPLATE GLENOSPHERE 252DPSTD - [id number] BASEPLATE GLENOSPHERE [id number] [id number] [employer]  Left 1 Implanted  GLENOSPHERE REV SHOULDER 36 - [id number] GLENOSPHERE REV SHOULDER 36 [id number]  Left 1 Implanted  SCREW BONE 6.5X40 SM - [id number] SCREW BONE 6.5X40 SM  [employer]  Left 1 Implanted  SCREW PERIPHERAL 42 - [id number] SCREW PERIPHERAL 42  [employer]  Left 1 Implanted  SCREW 5.5X22 - [id number] SCREW 5.5X22  [employer]  Left 1 Implanted  TRAY SHOULDER REV OFFSET 1.5 - [id number] TRAY SHOULDER REV OFFSET 1.5 [id number] [employer]  Left 1 Implanted  GLENOSPHERE REV SHOULDER 36 - [id number] GLENOSPHERE REV SHOULDER 36  [employer] [id number] 1 Implanted  CAP LOCKING COCR - [id number] CAP LOCKING COCR [id number]  Left 1 Implanted  PROXIMAL BODY PTC 11 132.5D - [id number] PROXIMAL BODY PTC 11 132.5D [id number]  Left 1 Implanted  SPACER REV AEQUALIS 11 - [id number] SPACER REV AEQUALIS 11 [id number]  Left 1 Implanted  aequalis flex revive spacer   [id number]  Left 1 Implanted    Indications for Surgery:   PJaylanie BoscheeRegister is a 61y.o. female with fall resulting in a proximal humerus fracture that was significantly comminuted and the patient who is a smoker and diabetic.  We operatively her actually nerve function was limited on testing secondary to fracture but the sensation was altered compared to the contralateral side.  Benefits and risks of operative and nonoperative management were discussed prior to  surgery with patient/guardian(s) and informed consent form was completed.  Infection and need for further surgery were discussed as was prosthetic stability and cuff issues.  We additionally specifically discussed risks of axillary nerve injury, infection, periprosthetic fracture, continued pain and longevity of implants prior to beginning  procedure.      Procedure:   The patient was identified in the preoperative holding area where the surgical site was marked. Block placed by anesthesia with exparel.  The patient was taken to the OR where a procedural timeout was called and the above noted anesthesia was induced.  The patient was positioned beachchair on allen table with spider arm positioner.  Preoperative antibiotics were dosed.  The patient's left shoulder was prepped and draped in the usual sterile fashion.  A second preoperative timeout was called.       Standard deltopectoral approach was performed with a #10 blade. We dissected down to the subcutaneous tissues and the cephalic vein was taken laterally with the deltoid. Clavipectoral fascia was incised in line with the incision. Deep retractors were placed. The long of the biceps tendon was identified and there was significant tenosynovitis present.  Tenodesis was performed to the pectoralis tendon with #2 Ethibond. The remaining biceps was followed up into the rotator interval where it was released.    We used the bicipital groove as a landmark for the lesser and greater tuberosity fragments.  We were able to mobilize the lesser tuberosity fragment and placed stay sutures in the bone tendon junction to help with mobilization.  This point we were able to identify the  greater tuberosity fragment and 4 #5  FiberWire sutures were used to place into this for eventual repair of the tuberosities.  Once these were both mobilized we took care to identify the shaft fragment as well as the head fragment.  We carefully identified the head fragment were able to manually remove it.  At this point the axillary nerve was found and palpated and with a tug test noted to be intact.  Protected throughout the remainder of the case with blunt retractors.    We then released the SGHL with bovie cautery prior to placing a curved mayo at the junction of the anterior glenoid well above the axillary nerve  and bluntly dissecting the subscapularis from the capsule.  We then carefully protected the axillary nerve as we gently released the inferior capsule to fully mobilize the subscapularis.  An anterior deltoid retractor was then placed as well as a small Hohmann retractor superiorly.   The glenoid was relatively preserved as we would expect in this fracture patient.  The remaining labrum was removed circumferentially taking great care not to disrupt the posterior capsule.    The glenoid drill guide was placed and used to drill a guide pin in the center, inferior position. The glenoid face was then reamed concentrically over the guide wire. The center hole was drilled over the guidepin in a near anatomic angle of version. Next the glenoid vault was drilled back to a depth of 40 mm.  We tapped and then placed a 15m size baseplate with 0 lateralization was selected with a 6.5 mm x 445mlength central screw.  The base plate was screwed into the glenoid vault obtaining secure fixation. We next placed superior and inferior locking screws for additional fixation.  Next a 36 mm glenosphere was selected and impacted onto the baseplate. The center screw was tightened.   We then repositioned the arm to give access to  the humeral shaft fragment.  Drill holes were placed and fiberwire sutures in the shaft for vertical fixation of the tuberosities.  We broached with the Revive stem implants  starting with a size 9 reamer and reaming up to 11 which obtained an appropriate fit.  The proximal body was sized separately and attached to trial and achieve a stable articulation.    We trialed with multiple size tray and polyethylene options and selected a 0 low which provided good stability and range of motion without excess soft tissue tension. The offset was dialed in to match the normal anatomy. The shoulder was trialed.  There was good ROM in all planes and the shoulder was stable with no inferior translation.   We then  mobilized her tuberosities again and placed the anterior deep limbs of the 4 #5 fiber wires around the stem.  1 of these was tied down fixing the greater tuberosity in place after bone graft harvest from the humeral head component was placed underneath.  A +0 low offset tray was selected and impacted onto the stem.   A 36+6 polyethylene liner was impacted onto the stem.  The joint was reduced and thoroughly irrigated with pulsatile lavage. The remaining sutures were then placed through the subscapularis and the bone tendon junction and the tuberosities were reduced after bone graft placed beneath as autograft at the subscap.  We horizontally secured the tuberosities before placing vertical fixation with the suture that was placed into the shaft.  Tuberosities moved as a unit were happy with her overall reduction.  This was checked on fluoroscopy confirming our position.  We irrigated copiously at this point.  Hemostasis was obtained. The deltopectoral interval was reapproximated with #1 Ethibond. The subcutaneous tissues were closed with 3-0 Vicryl and the skin was closed with running monocryl.     The wounds were cleaned and dried and an Aquacel dressing was placed. The drapes taken down. The arm was placed into sling with abduction pillow. Patient was awakened, extubated, and transferred to the recovery room in stable condition. There were no intraoperative complications. The sponge, needle, and attention counts were correct at the end of the case.     Noemi Chapel, PA-C, present and scrubbed throughout the case, critical for completion in a timely fashion, and for retraction, instrumentation, closure.

## 2021-09-16 NOTE — H&P (Signed)
PREOPERATIVE H&P  Chief Complaint: left proximal humerus fracture  HPI: [name] [name] is a 61 y.o. female who presents for preoperative history and physical with a diagnosis of left proximal humerus fracture. Symptoms are rated as moderate to severe, and have been worsening.  This is significantly impairing activities of daily living.  Please see my clinic note for full details on this patient's care.  She has elected for surgical management.   Past Medical History:  Diagnosis Date   Closed fracture of left proximal humerus 09/07/2021   Diabetes mellitus without complication (HCC)    Hypothyroidism    Peripheral neuropathy    Past Surgical History:  Procedure Laterality Date   SHOULDER SURGERY Bilateral    TUBAL LIGATION     Social History   Socioeconomic History   Marital status: Married    Spouse name: Not on file   Number of children: Not on file   Years of education: Not on file   Highest education level: Not on file  Occupational History   Not on file  Tobacco Use   Smoking status: Every Day    Packs/day: 0.50    Types: Cigarettes   Smokeless tobacco: Never  Vaping Use   Vaping Use: Never used  Substance and Sexual Activity   Alcohol use: Yes    Comment: occ   Drug use: Never   Sexual activity: Yes    Birth control/protection: Post-menopausal    Comment: BTL  Other Topics Concern   Not on file  Social History Narrative   Not on file   Social Determinants of Health   Financial Resource Strain: Not on file  Food Insecurity: Not on file  Transportation Needs: Not on file  Physical Activity: Not on file  Stress: Not on file  Social Connections: Not on file   Family History  Problem Relation Age of Onset   Breast cancer Maternal Aunt    Breast cancer Paternal Aunt    No Known Allergies Prior to Admission medications   Medication Sig Start Date End Date Taking? Authorizing Provider  gabapentin (NEURONTIN) 100 MG capsule TAKE 1 CAPSULE BY MOUTH AT  BEDTIME. Patient taking differently: 200 mg. 01/14/20  Yes Vivi Barrack, DPM  Glucagon (BAQSIMI ONE PACK) 3 MG/DOSE POWD Place into the nose.   Yes [provider]  Glucagon 1 MG/0.2ML SOAJ Inject into the skin.   Yes [provider]  lisinopril (ZESTRIL) 5 MG tablet Take 5 mg by mouth daily. 02/07/19  Yes [provider]  NOVOLOG 100 UNIT/ML injection  04/15/19  Yes [provider]  oxyCODONE-acetaminophen (PERCOCET/ROXICET) 5-325 MG tablet Take 1-2 tablets by mouth every 6 (six) hours as needed for severe pain. 09/07/21  Yes Petrucelli, Samantha R, PA-C  simvastatin (ZOCOR) 20 MG tablet Take 20 mg by mouth daily. 04/12/19  Yes [provider]  SYNTHROID 125 MCG tablet Take 125 mcg by mouth daily. 02/08/19  Yes [provider]  Vitamin D, Ergocalciferol, (DRISDOL) 1.25 MG (50000 UT) CAPS capsule Take 50,000 Units by mouth once a week. 04/05/19  Yes [provider]  CONTOUR NEXT TEST test strip  04/11/19   [provider]     Positive ROS: All other systems have been reviewed and were otherwise negative with the exception of those mentioned in the HPI and as above.  Physical Exam: General: Alert, no acute distress Cardiovascular: No pedal edema Respiratory: No cyanosis, no use of accessory musculature GI: No organomegaly, abdomen is soft and  non-tender Skin: No lesions in the area of chief complaint Neurologic: Sensation intact distally Psychiatric: Patient is competent for consent with normal mood and affect Lymphatic: No axillary or cervical lymphadenopathy  MUSCULOSKELETAL: Patient's motor function is difficult to test in the deltoid secondary to known fracture.  Axillary nerve sensory function is altered.  There is obvious a large hematoma proximally.  Distal motor and sensory functions intact.  Assessment: left proximal humerus fracture  Plan: Plan for Procedure(s): REVERSE SHOULDER  ARTHROPLASTY SHOULDER HEMI-ARTHROPLASTY  Unfortunately patient's actually nerve appears to be altered.  We warned her of long-term dysfunction of this.  We also warned that the reverse performed for fracture has a lower overall expected outcome then a reverse performed electively.  The risks benefits and alternatives were discussed with the patient including but not limited to the risks of nonoperative treatment, versus surgical intervention including infection, bleeding, nerve injury,  blood clots, cardiopulmonary complications, morbidity, mortality, among others, and they were willing to proceed.   Bjorn Pippin, MD  09/16/2021 7:23 AM

## 2021-09-16 NOTE — Anesthesia Procedure Notes (Signed)
Anesthesia Regional Block: Interscalene brachial plexus block   Pre-Anesthetic Checklist: , timeout performed,  Correct Patient, Correct Site, Correct Laterality,  Correct Procedure, Correct Position, site marked,  Risks and benefits discussed,  Surgical consent,  Pre-op evaluation,  At surgeon's request and post-op pain management  Laterality: Left  Prep: chloraprep       Needles:  Injection technique: Single-shot  Needle Type: Echogenic Stimulator Needle     Needle Length: 9cm  Needle Gauge: 21     Additional Needles:   Procedures:,,,, ultrasound used (permanent image in chart),,    Narrative:  Start time: 09/16/2021 7:20 AM End time: 09/16/2021 7:25 AM Injection made incrementally with aspirations every 5 mL.  Performed by: Personally  Anesthesiologist: Shelton Silvas, MD  Additional Notes: Patient tolerated the procedure well. Local anesthetic introduced in an incremental fashion under minimal resistance after negative aspirations. No paresthesias were elicited. After completion of the procedure, no acute issues were identified and patient continued to be monitored by RN.

## 2021-09-16 NOTE — Anesthesia Preprocedure Evaluation (Addendum)
Anesthesia Evaluation  Patient identified by MRN, date of birth, ID band Patient awake    Reviewed: Allergy & Precautions, NPO status , Patient's Chart, lab work & pertinent test results  Airway Mallampati: II  TM Distance: >3 FB Neck ROM: Full    Dental  (+) Teeth Intact, Dental Advisory Given   Pulmonary Current Smoker and Patient abstained from smoking.,    breath sounds clear to auscultation       Cardiovascular hypertension, Pt. on medications  Rhythm:Regular Rate:Normal     Neuro/Psych  Neuromuscular disease negative psych ROS   GI/Hepatic negative GI ROS, Neg liver ROS,   Endo/Other  diabetesHypothyroidism   Renal/GU negative Renal ROS     Musculoskeletal negative musculoskeletal ROS (+)   Abdominal Normal abdominal exam  (+)   Peds  Hematology negative hematology ROS (+)   Anesthesia Other Findings   Reproductive/Obstetrics                           Anesthesia Physical Anesthesia Plan  ASA: 2  Anesthesia Plan: General   Post-op Pain Management: Regional block*   Induction: Intravenous  PONV Risk Score and Plan: 3 and Ondansetron, Dexamethasone and Midazolam  Airway Management Planned: Oral ETT  Additional Equipment: None  Intra-op Plan:   Post-operative Plan: Extubation in OR  Informed Consent: I have reviewed the patients History and Physical, chart, labs and discussed the procedure including the risks, benefits and alternatives for the proposed anesthesia with the patient or authorized representative who has indicated his/her understanding and acceptance.     Dental advisory given  Plan Discussed with: CRNA  Anesthesia Plan Comments:        Anesthesia Quick Evaluation

## 2021-09-16 NOTE — Anesthesia Procedure Notes (Signed)
Procedure Name: Intubation Date/Time: 09/16/2021 8:09 AM Performed by: Thornell Mule, CRNA Pre-anesthesia Checklist: Patient identified, Emergency Drugs available, Suction available and Patient being monitored Patient Re-evaluated:Patient Re-evaluated prior to induction Oxygen Delivery Method: Circle system utilized Preoxygenation: Pre-oxygenation with 100% oxygen Induction Type: IV induction Ventilation: Mask ventilation without difficulty Laryngoscope Size: Miller and 3 Grade View: Grade II Tube type: Oral Tube size: 7.0 mm Number of attempts: 1 Airway Equipment and Method: Stylet and Oral airway Placement Confirmation: ETT inserted through vocal cords under direct vision, positive ETCO2 and breath sounds checked- equal and bilateral Secured at: 20 cm Tube secured with: Tape Dental Injury: Teeth and Oropharynx as per pre-operative assessment

## 2021-09-16 NOTE — Progress Notes (Signed)
Assisted Dr. Hollis with left, interscalene , ultrasound guided block. Side rails up, monitors on throughout procedure. See vital signs in flow sheet. Tolerated Procedure well. 

## 2021-09-16 NOTE — Anesthesia Postprocedure Evaluation (Signed)
Anesthesia Post Note  Patient: [name] [name]  Procedure(s) Performed: REVERSE SHOULDER ARTHROPLASTY (Left: Shoulder) SHOULDER HEMI-ARTHROPLASTY (Left: Shoulder)     Patient location during evaluation: PACU Anesthesia Type: General Level of consciousness: awake and alert Pain management: pain level controlled Vital Signs Assessment: post-procedure vital signs reviewed and stable Respiratory status: spontaneous breathing, nonlabored ventilation, respiratory function stable and patient connected to nasal cannula oxygen Cardiovascular status: blood pressure returned to baseline and stable Postop Assessment: no apparent nausea or vomiting Anesthetic complications: no   No notable events documented.  Last Vitals:  Vitals:   09/16/21 1030 09/16/21 1130  BP: (!) 141/68 139/72  Pulse: (!) 102 (!) 106  Resp: 13 16  Temp:  37.2 C  SpO2: 92% 92%    Last Pain:  Vitals:   09/16/21 1130  TempSrc:   PainSc: 0-No pain                 Shelton Silvas

## 2021-09-21 DIAGNOSIS — E1042 Type 1 diabetes mellitus with diabetic polyneuropathy: Secondary | ICD-10-CM | POA: Diagnosis not present

## 2021-09-23 ENCOUNTER — Encounter (HOSPITAL_BASED_OUTPATIENT_CLINIC_OR_DEPARTMENT_OTHER): Payer: Self-pay | Admitting: Orthopaedic Surgery

## 2021-09-23 DIAGNOSIS — S42202D Unspecified fracture of upper end of left humerus, subsequent encounter for fracture with routine healing: Secondary | ICD-10-CM | POA: Diagnosis not present

## 2021-10-14 DIAGNOSIS — M25612 Stiffness of left shoulder, not elsewhere classified: Secondary | ICD-10-CM | POA: Diagnosis not present

## 2021-10-14 DIAGNOSIS — M6281 Muscle weakness (generalized): Secondary | ICD-10-CM | POA: Diagnosis not present

## 2021-10-14 DIAGNOSIS — S42202D Unspecified fracture of upper end of left humerus, subsequent encounter for fracture with routine healing: Secondary | ICD-10-CM | POA: Diagnosis not present

## 2021-10-14 DIAGNOSIS — S42201D Unspecified fracture of upper end of right humerus, subsequent encounter for fracture with routine healing: Secondary | ICD-10-CM | POA: Diagnosis not present

## 2021-10-20 DIAGNOSIS — M6281 Muscle weakness (generalized): Secondary | ICD-10-CM | POA: Diagnosis not present

## 2021-10-20 DIAGNOSIS — M25612 Stiffness of left shoulder, not elsewhere classified: Secondary | ICD-10-CM | POA: Diagnosis not present

## 2021-10-20 DIAGNOSIS — S42201D Unspecified fracture of upper end of right humerus, subsequent encounter for fracture with routine healing: Secondary | ICD-10-CM | POA: Diagnosis not present

## 2021-10-25 DIAGNOSIS — E1042 Type 1 diabetes mellitus with diabetic polyneuropathy: Secondary | ICD-10-CM | POA: Diagnosis not present

## 2021-10-27 DIAGNOSIS — S42201D Unspecified fracture of upper end of right humerus, subsequent encounter for fracture with routine healing: Secondary | ICD-10-CM | POA: Diagnosis not present

## 2021-10-27 DIAGNOSIS — M6281 Muscle weakness (generalized): Secondary | ICD-10-CM | POA: Diagnosis not present

## 2021-10-27 DIAGNOSIS — M25612 Stiffness of left shoulder, not elsewhere classified: Secondary | ICD-10-CM | POA: Diagnosis not present

## 2021-11-01 DIAGNOSIS — M6281 Muscle weakness (generalized): Secondary | ICD-10-CM | POA: Diagnosis not present

## 2021-11-01 DIAGNOSIS — S42201D Unspecified fracture of upper end of right humerus, subsequent encounter for fracture with routine healing: Secondary | ICD-10-CM | POA: Diagnosis not present

## 2021-11-01 DIAGNOSIS — M25612 Stiffness of left shoulder, not elsewhere classified: Secondary | ICD-10-CM | POA: Diagnosis not present

## 2021-11-03 DIAGNOSIS — S42201D Unspecified fracture of upper end of right humerus, subsequent encounter for fracture with routine healing: Secondary | ICD-10-CM | POA: Diagnosis not present

## 2021-11-03 DIAGNOSIS — M6281 Muscle weakness (generalized): Secondary | ICD-10-CM | POA: Diagnosis not present

## 2021-11-03 DIAGNOSIS — M25612 Stiffness of left shoulder, not elsewhere classified: Secondary | ICD-10-CM | POA: Diagnosis not present

## 2021-11-09 DIAGNOSIS — M6281 Muscle weakness (generalized): Secondary | ICD-10-CM | POA: Diagnosis not present

## 2021-11-09 DIAGNOSIS — M25612 Stiffness of left shoulder, not elsewhere classified: Secondary | ICD-10-CM | POA: Diagnosis not present

## 2021-11-09 DIAGNOSIS — S42201D Unspecified fracture of upper end of right humerus, subsequent encounter for fracture with routine healing: Secondary | ICD-10-CM | POA: Diagnosis not present

## 2021-11-11 DIAGNOSIS — S42201D Unspecified fracture of upper end of right humerus, subsequent encounter for fracture with routine healing: Secondary | ICD-10-CM | POA: Diagnosis not present

## 2021-11-11 DIAGNOSIS — M25612 Stiffness of left shoulder, not elsewhere classified: Secondary | ICD-10-CM | POA: Diagnosis not present

## 2021-11-11 DIAGNOSIS — M6281 Muscle weakness (generalized): Secondary | ICD-10-CM | POA: Diagnosis not present

## 2021-11-16 DIAGNOSIS — M6281 Muscle weakness (generalized): Secondary | ICD-10-CM | POA: Diagnosis not present

## 2021-11-16 DIAGNOSIS — S42201D Unspecified fracture of upper end of right humerus, subsequent encounter for fracture with routine healing: Secondary | ICD-10-CM | POA: Diagnosis not present

## 2021-11-16 DIAGNOSIS — M25612 Stiffness of left shoulder, not elsewhere classified: Secondary | ICD-10-CM | POA: Diagnosis not present

## 2021-11-22 DIAGNOSIS — M25612 Stiffness of left shoulder, not elsewhere classified: Secondary | ICD-10-CM | POA: Diagnosis not present

## 2021-11-22 DIAGNOSIS — S42201D Unspecified fracture of upper end of right humerus, subsequent encounter for fracture with routine healing: Secondary | ICD-10-CM | POA: Diagnosis not present

## 2021-11-22 DIAGNOSIS — M6281 Muscle weakness (generalized): Secondary | ICD-10-CM | POA: Diagnosis not present

## 2021-11-24 DIAGNOSIS — M25612 Stiffness of left shoulder, not elsewhere classified: Secondary | ICD-10-CM | POA: Diagnosis not present

## 2021-11-24 DIAGNOSIS — S42201D Unspecified fracture of upper end of right humerus, subsequent encounter for fracture with routine healing: Secondary | ICD-10-CM | POA: Diagnosis not present

## 2021-11-24 DIAGNOSIS — M6281 Muscle weakness (generalized): Secondary | ICD-10-CM | POA: Diagnosis not present

## 2021-11-25 DIAGNOSIS — M542 Cervicalgia: Secondary | ICD-10-CM | POA: Diagnosis not present

## 2021-11-25 DIAGNOSIS — M25512 Pain in left shoulder: Secondary | ICD-10-CM | POA: Diagnosis not present

## 2021-11-25 DIAGNOSIS — M25522 Pain in left elbow: Secondary | ICD-10-CM | POA: Diagnosis not present

## 2021-11-29 DIAGNOSIS — M6281 Muscle weakness (generalized): Secondary | ICD-10-CM | POA: Diagnosis not present

## 2021-11-29 DIAGNOSIS — S42201D Unspecified fracture of upper end of right humerus, subsequent encounter for fracture with routine healing: Secondary | ICD-10-CM | POA: Diagnosis not present

## 2021-11-29 DIAGNOSIS — M25612 Stiffness of left shoulder, not elsewhere classified: Secondary | ICD-10-CM | POA: Diagnosis not present

## 2021-12-01 DIAGNOSIS — M6281 Muscle weakness (generalized): Secondary | ICD-10-CM | POA: Diagnosis not present

## 2021-12-01 DIAGNOSIS — M25612 Stiffness of left shoulder, not elsewhere classified: Secondary | ICD-10-CM | POA: Diagnosis not present

## 2021-12-01 DIAGNOSIS — S42201D Unspecified fracture of upper end of right humerus, subsequent encounter for fracture with routine healing: Secondary | ICD-10-CM | POA: Diagnosis not present

## 2021-12-06 DIAGNOSIS — S42201D Unspecified fracture of upper end of right humerus, subsequent encounter for fracture with routine healing: Secondary | ICD-10-CM | POA: Diagnosis not present

## 2021-12-06 DIAGNOSIS — M6281 Muscle weakness (generalized): Secondary | ICD-10-CM | POA: Diagnosis not present

## 2021-12-06 DIAGNOSIS — M25612 Stiffness of left shoulder, not elsewhere classified: Secondary | ICD-10-CM | POA: Diagnosis not present

## 2021-12-08 DIAGNOSIS — M25612 Stiffness of left shoulder, not elsewhere classified: Secondary | ICD-10-CM | POA: Diagnosis not present

## 2021-12-08 DIAGNOSIS — S42201D Unspecified fracture of upper end of right humerus, subsequent encounter for fracture with routine healing: Secondary | ICD-10-CM | POA: Diagnosis not present

## 2021-12-08 DIAGNOSIS — M6281 Muscle weakness (generalized): Secondary | ICD-10-CM | POA: Diagnosis not present

## 2021-12-13 DIAGNOSIS — S42201D Unspecified fracture of upper end of right humerus, subsequent encounter for fracture with routine healing: Secondary | ICD-10-CM | POA: Diagnosis not present

## 2021-12-13 DIAGNOSIS — M25612 Stiffness of left shoulder, not elsewhere classified: Secondary | ICD-10-CM | POA: Diagnosis not present

## 2021-12-13 DIAGNOSIS — M6281 Muscle weakness (generalized): Secondary | ICD-10-CM | POA: Diagnosis not present

## 2021-12-15 DIAGNOSIS — M25612 Stiffness of left shoulder, not elsewhere classified: Secondary | ICD-10-CM | POA: Diagnosis not present

## 2021-12-15 DIAGNOSIS — S42201D Unspecified fracture of upper end of right humerus, subsequent encounter for fracture with routine healing: Secondary | ICD-10-CM | POA: Diagnosis not present

## 2021-12-15 DIAGNOSIS — M6281 Muscle weakness (generalized): Secondary | ICD-10-CM | POA: Diagnosis not present

## 2021-12-23 DIAGNOSIS — M25522 Pain in left elbow: Secondary | ICD-10-CM | POA: Diagnosis not present

## 2021-12-28 DIAGNOSIS — E785 Hyperlipidemia, unspecified: Secondary | ICD-10-CM | POA: Diagnosis not present

## 2021-12-28 DIAGNOSIS — E1042 Type 1 diabetes mellitus with diabetic polyneuropathy: Secondary | ICD-10-CM | POA: Diagnosis not present

## 2022-01-20 DIAGNOSIS — M25512 Pain in left shoulder: Secondary | ICD-10-CM | POA: Diagnosis not present

## 2022-02-09 DIAGNOSIS — M79642 Pain in left hand: Secondary | ICD-10-CM | POA: Diagnosis not present

## 2022-02-09 DIAGNOSIS — M25632 Stiffness of left wrist, not elsewhere classified: Secondary | ICD-10-CM | POA: Diagnosis not present

## 2022-02-09 DIAGNOSIS — M25612 Stiffness of left shoulder, not elsewhere classified: Secondary | ICD-10-CM | POA: Diagnosis not present

## 2022-02-09 DIAGNOSIS — M25642 Stiffness of left hand, not elsewhere classified: Secondary | ICD-10-CM | POA: Diagnosis not present

## 2022-02-09 DIAGNOSIS — R200 Anesthesia of skin: Secondary | ICD-10-CM | POA: Diagnosis not present

## 2022-02-17 DIAGNOSIS — M25612 Stiffness of left shoulder, not elsewhere classified: Secondary | ICD-10-CM | POA: Diagnosis not present

## 2022-02-17 DIAGNOSIS — M25632 Stiffness of left wrist, not elsewhere classified: Secondary | ICD-10-CM | POA: Diagnosis not present

## 2022-02-17 DIAGNOSIS — R200 Anesthesia of skin: Secondary | ICD-10-CM | POA: Diagnosis not present

## 2022-02-17 DIAGNOSIS — M25642 Stiffness of left hand, not elsewhere classified: Secondary | ICD-10-CM | POA: Diagnosis not present

## 2022-02-24 DIAGNOSIS — M25612 Stiffness of left shoulder, not elsewhere classified: Secondary | ICD-10-CM | POA: Diagnosis not present

## 2022-02-24 DIAGNOSIS — M25642 Stiffness of left hand, not elsewhere classified: Secondary | ICD-10-CM | POA: Diagnosis not present

## 2022-02-24 DIAGNOSIS — M25632 Stiffness of left wrist, not elsewhere classified: Secondary | ICD-10-CM | POA: Diagnosis not present

## 2022-02-24 DIAGNOSIS — M79642 Pain in left hand: Secondary | ICD-10-CM | POA: Diagnosis not present

## 2022-02-24 DIAGNOSIS — R200 Anesthesia of skin: Secondary | ICD-10-CM | POA: Diagnosis not present

## 2022-03-01 DIAGNOSIS — Z23 Encounter for immunization: Secondary | ICD-10-CM | POA: Diagnosis not present

## 2022-03-01 DIAGNOSIS — E1042 Type 1 diabetes mellitus with diabetic polyneuropathy: Secondary | ICD-10-CM | POA: Diagnosis not present

## 2022-03-03 DIAGNOSIS — M25642 Stiffness of left hand, not elsewhere classified: Secondary | ICD-10-CM | POA: Diagnosis not present

## 2022-03-03 DIAGNOSIS — R200 Anesthesia of skin: Secondary | ICD-10-CM | POA: Diagnosis not present

## 2022-03-03 DIAGNOSIS — M79642 Pain in left hand: Secondary | ICD-10-CM | POA: Diagnosis not present

## 2022-03-03 DIAGNOSIS — M25632 Stiffness of left wrist, not elsewhere classified: Secondary | ICD-10-CM | POA: Diagnosis not present

## 2022-03-16 DIAGNOSIS — M25612 Stiffness of left shoulder, not elsewhere classified: Secondary | ICD-10-CM | POA: Diagnosis not present

## 2022-03-16 DIAGNOSIS — M79642 Pain in left hand: Secondary | ICD-10-CM | POA: Diagnosis not present

## 2022-03-16 DIAGNOSIS — M25642 Stiffness of left hand, not elsewhere classified: Secondary | ICD-10-CM | POA: Diagnosis not present

## 2022-03-16 DIAGNOSIS — M25632 Stiffness of left wrist, not elsewhere classified: Secondary | ICD-10-CM | POA: Diagnosis not present

## 2022-03-23 DIAGNOSIS — M25612 Stiffness of left shoulder, not elsewhere classified: Secondary | ICD-10-CM | POA: Diagnosis not present

## 2022-03-23 DIAGNOSIS — M25642 Stiffness of left hand, not elsewhere classified: Secondary | ICD-10-CM | POA: Diagnosis not present

## 2022-03-23 DIAGNOSIS — M25632 Stiffness of left wrist, not elsewhere classified: Secondary | ICD-10-CM | POA: Diagnosis not present

## 2022-03-23 DIAGNOSIS — R200 Anesthesia of skin: Secondary | ICD-10-CM | POA: Diagnosis not present

## 2022-03-23 DIAGNOSIS — M79642 Pain in left hand: Secondary | ICD-10-CM | POA: Diagnosis not present

## 2022-03-30 DIAGNOSIS — M25632 Stiffness of left wrist, not elsewhere classified: Secondary | ICD-10-CM | POA: Diagnosis not present

## 2022-03-30 DIAGNOSIS — M79642 Pain in left hand: Secondary | ICD-10-CM | POA: Diagnosis not present

## 2022-03-30 DIAGNOSIS — M25642 Stiffness of left hand, not elsewhere classified: Secondary | ICD-10-CM | POA: Diagnosis not present

## 2022-03-30 DIAGNOSIS — R200 Anesthesia of skin: Secondary | ICD-10-CM | POA: Diagnosis not present

## 2022-04-21 DIAGNOSIS — M25512 Pain in left shoulder: Secondary | ICD-10-CM | POA: Diagnosis not present

## 2022-05-03 DIAGNOSIS — E1042 Type 1 diabetes mellitus with diabetic polyneuropathy: Secondary | ICD-10-CM | POA: Diagnosis not present

## 2022-07-01 DIAGNOSIS — E049 Nontoxic goiter, unspecified: Secondary | ICD-10-CM | POA: Diagnosis not present

## 2022-07-01 DIAGNOSIS — E559 Vitamin D deficiency, unspecified: Secondary | ICD-10-CM | POA: Diagnosis not present

## 2022-07-01 DIAGNOSIS — F418 Other specified anxiety disorders: Secondary | ICD-10-CM | POA: Diagnosis not present

## 2022-07-01 DIAGNOSIS — I700 Atherosclerosis of aorta: Secondary | ICD-10-CM | POA: Diagnosis not present

## 2022-07-01 DIAGNOSIS — M7741 Metatarsalgia, right foot: Secondary | ICD-10-CM | POA: Diagnosis not present

## 2022-07-01 DIAGNOSIS — E039 Hypothyroidism, unspecified: Secondary | ICD-10-CM | POA: Diagnosis not present

## 2022-07-01 DIAGNOSIS — K5900 Constipation, unspecified: Secondary | ICD-10-CM | POA: Diagnosis not present

## 2022-07-01 DIAGNOSIS — M654 Radial styloid tenosynovitis [de Quervain]: Secondary | ICD-10-CM | POA: Diagnosis not present

## 2022-07-01 DIAGNOSIS — I739 Peripheral vascular disease, unspecified: Secondary | ICD-10-CM | POA: Diagnosis not present

## 2022-07-01 DIAGNOSIS — E1042 Type 1 diabetes mellitus with diabetic polyneuropathy: Secondary | ICD-10-CM | POA: Diagnosis not present

## 2022-07-01 DIAGNOSIS — E785 Hyperlipidemia, unspecified: Secondary | ICD-10-CM | POA: Diagnosis not present

## 2022-07-01 DIAGNOSIS — Z720 Tobacco use: Secondary | ICD-10-CM | POA: Diagnosis not present

## 2022-08-23 DIAGNOSIS — Z794 Long term (current) use of insulin: Secondary | ICD-10-CM | POA: Diagnosis not present

## 2022-08-23 DIAGNOSIS — E1042 Type 1 diabetes mellitus with diabetic polyneuropathy: Secondary | ICD-10-CM | POA: Diagnosis not present

## 2022-08-23 DIAGNOSIS — Z4681 Encounter for fitting and adjustment of insulin pump: Secondary | ICD-10-CM | POA: Diagnosis not present

## 2022-08-23 DIAGNOSIS — E785 Hyperlipidemia, unspecified: Secondary | ICD-10-CM | POA: Diagnosis not present

## 2022-08-25 DIAGNOSIS — E10319 Type 1 diabetes mellitus with unspecified diabetic retinopathy without macular edema: Secondary | ICD-10-CM | POA: Diagnosis not present

## 2022-08-25 DIAGNOSIS — E1065 Type 1 diabetes mellitus with hyperglycemia: Secondary | ICD-10-CM | POA: Diagnosis not present

## 2022-09-15 DIAGNOSIS — E10319 Type 1 diabetes mellitus with unspecified diabetic retinopathy without macular edema: Secondary | ICD-10-CM | POA: Diagnosis not present

## 2022-09-15 DIAGNOSIS — E1042 Type 1 diabetes mellitus with diabetic polyneuropathy: Secondary | ICD-10-CM | POA: Diagnosis not present

## 2022-09-15 DIAGNOSIS — E1065 Type 1 diabetes mellitus with hyperglycemia: Secondary | ICD-10-CM | POA: Diagnosis not present

## 2022-11-01 DIAGNOSIS — E039 Hypothyroidism, unspecified: Secondary | ICD-10-CM | POA: Diagnosis not present

## 2022-11-01 DIAGNOSIS — E559 Vitamin D deficiency, unspecified: Secondary | ICD-10-CM | POA: Diagnosis not present

## 2022-11-01 DIAGNOSIS — Z4681 Encounter for fitting and adjustment of insulin pump: Secondary | ICD-10-CM | POA: Diagnosis not present

## 2022-11-01 DIAGNOSIS — M654 Radial styloid tenosynovitis [de Quervain]: Secondary | ICD-10-CM | POA: Diagnosis not present

## 2022-11-01 DIAGNOSIS — E049 Nontoxic goiter, unspecified: Secondary | ICD-10-CM | POA: Diagnosis not present

## 2022-11-01 DIAGNOSIS — L84 Corns and callosities: Secondary | ICD-10-CM | POA: Diagnosis not present

## 2022-11-01 DIAGNOSIS — E785 Hyperlipidemia, unspecified: Secondary | ICD-10-CM | POA: Diagnosis not present

## 2022-11-01 DIAGNOSIS — F418 Other specified anxiety disorders: Secondary | ICD-10-CM | POA: Diagnosis not present

## 2022-11-01 DIAGNOSIS — E1042 Type 1 diabetes mellitus with diabetic polyneuropathy: Secondary | ICD-10-CM | POA: Diagnosis not present

## 2022-11-01 DIAGNOSIS — I739 Peripheral vascular disease, unspecified: Secondary | ICD-10-CM | POA: Diagnosis not present

## 2022-11-01 DIAGNOSIS — Z720 Tobacco use: Secondary | ICD-10-CM | POA: Diagnosis not present

## 2022-11-01 DIAGNOSIS — I700 Atherosclerosis of aorta: Secondary | ICD-10-CM | POA: Diagnosis not present

## 2022-11-15 DIAGNOSIS — E1065 Type 1 diabetes mellitus with hyperglycemia: Secondary | ICD-10-CM | POA: Diagnosis not present

## 2022-11-15 DIAGNOSIS — E10319 Type 1 diabetes mellitus with unspecified diabetic retinopathy without macular edema: Secondary | ICD-10-CM | POA: Diagnosis not present

## 2022-11-15 DIAGNOSIS — E1042 Type 1 diabetes mellitus with diabetic polyneuropathy: Secondary | ICD-10-CM | POA: Diagnosis not present

## 2022-12-14 DIAGNOSIS — E10319 Type 1 diabetes mellitus with unspecified diabetic retinopathy without macular edema: Secondary | ICD-10-CM | POA: Diagnosis not present

## 2022-12-14 DIAGNOSIS — E1042 Type 1 diabetes mellitus with diabetic polyneuropathy: Secondary | ICD-10-CM | POA: Diagnosis not present

## 2022-12-14 DIAGNOSIS — E1065 Type 1 diabetes mellitus with hyperglycemia: Secondary | ICD-10-CM | POA: Diagnosis not present

## 2023-01-04 DIAGNOSIS — Z794 Long term (current) use of insulin: Secondary | ICD-10-CM | POA: Diagnosis not present

## 2023-01-04 DIAGNOSIS — E1042 Type 1 diabetes mellitus with diabetic polyneuropathy: Secondary | ICD-10-CM | POA: Diagnosis not present

## 2023-01-04 DIAGNOSIS — E785 Hyperlipidemia, unspecified: Secondary | ICD-10-CM | POA: Diagnosis not present

## 2023-01-04 DIAGNOSIS — Z4681 Encounter for fitting and adjustment of insulin pump: Secondary | ICD-10-CM | POA: Diagnosis not present

## 2023-02-13 DIAGNOSIS — E10319 Type 1 diabetes mellitus with unspecified diabetic retinopathy without macular edema: Secondary | ICD-10-CM | POA: Diagnosis not present

## 2023-02-13 DIAGNOSIS — E1042 Type 1 diabetes mellitus with diabetic polyneuropathy: Secondary | ICD-10-CM | POA: Diagnosis not present

## 2023-02-13 DIAGNOSIS — E1065 Type 1 diabetes mellitus with hyperglycemia: Secondary | ICD-10-CM | POA: Diagnosis not present

## 2023-03-06 DIAGNOSIS — E1042 Type 1 diabetes mellitus with diabetic polyneuropathy: Secondary | ICD-10-CM | POA: Diagnosis not present

## 2023-03-06 DIAGNOSIS — E1065 Type 1 diabetes mellitus with hyperglycemia: Secondary | ICD-10-CM | POA: Diagnosis not present

## 2023-03-06 DIAGNOSIS — E10319 Type 1 diabetes mellitus with unspecified diabetic retinopathy without macular edema: Secondary | ICD-10-CM | POA: Diagnosis not present

## 2023-03-09 DIAGNOSIS — I700 Atherosclerosis of aorta: Secondary | ICD-10-CM | POA: Diagnosis not present

## 2023-03-09 DIAGNOSIS — M7741 Metatarsalgia, right foot: Secondary | ICD-10-CM | POA: Diagnosis not present

## 2023-03-09 DIAGNOSIS — Z4681 Encounter for fitting and adjustment of insulin pump: Secondary | ICD-10-CM | POA: Diagnosis not present

## 2023-03-09 DIAGNOSIS — E559 Vitamin D deficiency, unspecified: Secondary | ICD-10-CM | POA: Diagnosis not present

## 2023-03-09 DIAGNOSIS — F418 Other specified anxiety disorders: Secondary | ICD-10-CM | POA: Diagnosis not present

## 2023-03-09 DIAGNOSIS — E1042 Type 1 diabetes mellitus with diabetic polyneuropathy: Secondary | ICD-10-CM | POA: Diagnosis not present

## 2023-03-09 DIAGNOSIS — M654 Radial styloid tenosynovitis [de Quervain]: Secondary | ICD-10-CM | POA: Diagnosis not present

## 2023-03-09 DIAGNOSIS — I739 Peripheral vascular disease, unspecified: Secondary | ICD-10-CM | POA: Diagnosis not present

## 2023-03-09 DIAGNOSIS — Z23 Encounter for immunization: Secondary | ICD-10-CM | POA: Diagnosis not present

## 2023-03-09 DIAGNOSIS — E785 Hyperlipidemia, unspecified: Secondary | ICD-10-CM | POA: Diagnosis not present

## 2023-03-09 DIAGNOSIS — E049 Nontoxic goiter, unspecified: Secondary | ICD-10-CM | POA: Diagnosis not present

## 2023-03-09 DIAGNOSIS — E039 Hypothyroidism, unspecified: Secondary | ICD-10-CM | POA: Diagnosis not present

## 2023-05-04 DIAGNOSIS — E1042 Type 1 diabetes mellitus with diabetic polyneuropathy: Secondary | ICD-10-CM | POA: Diagnosis not present

## 2023-05-04 DIAGNOSIS — Z4681 Encounter for fitting and adjustment of insulin pump: Secondary | ICD-10-CM | POA: Diagnosis not present

## 2023-05-04 DIAGNOSIS — E785 Hyperlipidemia, unspecified: Secondary | ICD-10-CM | POA: Diagnosis not present

## 2023-05-04 DIAGNOSIS — Z794 Long term (current) use of insulin: Secondary | ICD-10-CM | POA: Diagnosis not present

## 2023-05-05 DIAGNOSIS — E10319 Type 1 diabetes mellitus with unspecified diabetic retinopathy without macular edema: Secondary | ICD-10-CM | POA: Diagnosis not present

## 2023-05-05 DIAGNOSIS — E1065 Type 1 diabetes mellitus with hyperglycemia: Secondary | ICD-10-CM | POA: Diagnosis not present

## 2023-05-05 DIAGNOSIS — E1042 Type 1 diabetes mellitus with diabetic polyneuropathy: Secondary | ICD-10-CM | POA: Diagnosis not present

## 2023-05-26 DIAGNOSIS — E10319 Type 1 diabetes mellitus with unspecified diabetic retinopathy without macular edema: Secondary | ICD-10-CM | POA: Diagnosis not present

## 2023-05-26 DIAGNOSIS — E1042 Type 1 diabetes mellitus with diabetic polyneuropathy: Secondary | ICD-10-CM | POA: Diagnosis not present

## 2023-05-26 DIAGNOSIS — E1065 Type 1 diabetes mellitus with hyperglycemia: Secondary | ICD-10-CM | POA: Diagnosis not present

## 2023-07-10 DIAGNOSIS — Z794 Long term (current) use of insulin: Secondary | ICD-10-CM | POA: Diagnosis not present

## 2023-07-10 DIAGNOSIS — E039 Hypothyroidism, unspecified: Secondary | ICD-10-CM | POA: Diagnosis not present

## 2023-07-10 DIAGNOSIS — Z1212 Encounter for screening for malignant neoplasm of rectum: Secondary | ICD-10-CM | POA: Diagnosis not present

## 2023-07-10 DIAGNOSIS — E559 Vitamin D deficiency, unspecified: Secondary | ICD-10-CM | POA: Diagnosis not present

## 2023-07-10 DIAGNOSIS — E785 Hyperlipidemia, unspecified: Secondary | ICD-10-CM | POA: Diagnosis not present

## 2023-07-10 DIAGNOSIS — E1042 Type 1 diabetes mellitus with diabetic polyneuropathy: Secondary | ICD-10-CM | POA: Diagnosis not present

## 2023-07-17 DIAGNOSIS — Z4681 Encounter for fitting and adjustment of insulin pump: Secondary | ICD-10-CM | POA: Diagnosis not present

## 2023-07-17 DIAGNOSIS — R82998 Other abnormal findings in urine: Secondary | ICD-10-CM | POA: Diagnosis not present

## 2023-07-17 DIAGNOSIS — I700 Atherosclerosis of aorta: Secondary | ICD-10-CM | POA: Diagnosis not present

## 2023-07-17 DIAGNOSIS — I739 Peripheral vascular disease, unspecified: Secondary | ICD-10-CM | POA: Diagnosis not present

## 2023-07-17 DIAGNOSIS — M654 Radial styloid tenosynovitis [de Quervain]: Secondary | ICD-10-CM | POA: Diagnosis not present

## 2023-07-17 DIAGNOSIS — Z0000 Encounter for general adult medical examination without abnormal findings: Secondary | ICD-10-CM | POA: Diagnosis not present

## 2023-07-17 DIAGNOSIS — E1042 Type 1 diabetes mellitus with diabetic polyneuropathy: Secondary | ICD-10-CM | POA: Diagnosis not present

## 2023-07-17 DIAGNOSIS — Z1331 Encounter for screening for depression: Secondary | ICD-10-CM | POA: Diagnosis not present

## 2023-07-17 DIAGNOSIS — I10 Essential (primary) hypertension: Secondary | ICD-10-CM | POA: Diagnosis not present

## 2023-07-17 DIAGNOSIS — E039 Hypothyroidism, unspecified: Secondary | ICD-10-CM | POA: Diagnosis not present

## 2023-07-17 DIAGNOSIS — E049 Nontoxic goiter, unspecified: Secondary | ICD-10-CM | POA: Diagnosis not present

## 2023-07-17 DIAGNOSIS — M7741 Metatarsalgia, right foot: Secondary | ICD-10-CM | POA: Diagnosis not present

## 2023-07-17 DIAGNOSIS — E785 Hyperlipidemia, unspecified: Secondary | ICD-10-CM | POA: Diagnosis not present

## 2023-07-17 DIAGNOSIS — Z720 Tobacco use: Secondary | ICD-10-CM | POA: Diagnosis not present

## 2023-08-04 DIAGNOSIS — E1065 Type 1 diabetes mellitus with hyperglycemia: Secondary | ICD-10-CM | POA: Diagnosis not present

## 2023-08-04 DIAGNOSIS — E1042 Type 1 diabetes mellitus with diabetic polyneuropathy: Secondary | ICD-10-CM | POA: Diagnosis not present

## 2023-08-04 DIAGNOSIS — E10319 Type 1 diabetes mellitus with unspecified diabetic retinopathy without macular edema: Secondary | ICD-10-CM | POA: Diagnosis not present

## 2023-08-15 DIAGNOSIS — E1042 Type 1 diabetes mellitus with diabetic polyneuropathy: Secondary | ICD-10-CM | POA: Diagnosis not present

## 2023-08-15 DIAGNOSIS — E1065 Type 1 diabetes mellitus with hyperglycemia: Secondary | ICD-10-CM | POA: Diagnosis not present

## 2023-08-15 DIAGNOSIS — E10319 Type 1 diabetes mellitus with unspecified diabetic retinopathy without macular edema: Secondary | ICD-10-CM | POA: Diagnosis not present

## 2023-08-23 DIAGNOSIS — Z4681 Encounter for fitting and adjustment of insulin pump: Secondary | ICD-10-CM | POA: Diagnosis not present

## 2023-08-23 DIAGNOSIS — Z794 Long term (current) use of insulin: Secondary | ICD-10-CM | POA: Diagnosis not present

## 2023-08-23 DIAGNOSIS — E1042 Type 1 diabetes mellitus with diabetic polyneuropathy: Secondary | ICD-10-CM | POA: Diagnosis not present

## 2023-08-23 DIAGNOSIS — E785 Hyperlipidemia, unspecified: Secondary | ICD-10-CM | POA: Diagnosis not present

## 2023-11-06 DIAGNOSIS — E1042 Type 1 diabetes mellitus with diabetic polyneuropathy: Secondary | ICD-10-CM | POA: Diagnosis not present

## 2023-11-06 DIAGNOSIS — E1065 Type 1 diabetes mellitus with hyperglycemia: Secondary | ICD-10-CM | POA: Diagnosis not present

## 2023-11-06 DIAGNOSIS — E10319 Type 1 diabetes mellitus with unspecified diabetic retinopathy without macular edema: Secondary | ICD-10-CM | POA: Diagnosis not present

## 2023-11-14 DIAGNOSIS — E1042 Type 1 diabetes mellitus with diabetic polyneuropathy: Secondary | ICD-10-CM | POA: Diagnosis not present

## 2023-11-14 DIAGNOSIS — E1065 Type 1 diabetes mellitus with hyperglycemia: Secondary | ICD-10-CM | POA: Diagnosis not present

## 2023-11-14 DIAGNOSIS — E10319 Type 1 diabetes mellitus with unspecified diabetic retinopathy without macular edema: Secondary | ICD-10-CM | POA: Diagnosis not present

## 2023-12-27 DIAGNOSIS — E1042 Type 1 diabetes mellitus with diabetic polyneuropathy: Secondary | ICD-10-CM | POA: Diagnosis not present

## 2023-12-27 DIAGNOSIS — Z794 Long term (current) use of insulin: Secondary | ICD-10-CM | POA: Diagnosis not present

## 2023-12-27 DIAGNOSIS — Z7189 Other specified counseling: Secondary | ICD-10-CM | POA: Diagnosis not present

## 2023-12-27 DIAGNOSIS — Z4681 Encounter for fitting and adjustment of insulin pump: Secondary | ICD-10-CM | POA: Diagnosis not present

## 2023-12-27 DIAGNOSIS — E785 Hyperlipidemia, unspecified: Secondary | ICD-10-CM | POA: Diagnosis not present

## 2024-01-16 ENCOUNTER — Other Ambulatory Visit: Payer: Self-pay | Admitting: Endocrinology

## 2024-01-16 DIAGNOSIS — Z1231 Encounter for screening mammogram for malignant neoplasm of breast: Secondary | ICD-10-CM

## 2024-01-31 ENCOUNTER — Ambulatory Visit
Admission: RE | Admit: 2024-01-31 | Discharge: 2024-01-31 | Disposition: A | Discharge status: Home / Self Care | Source: Ambulatory Visit | Attending: Endocrinology | Admitting: Endocrinology

## 2024-01-31 DIAGNOSIS — Z1231 Encounter for screening mammogram for malignant neoplasm of breast: Secondary | ICD-10-CM

## 2024-02-05 ENCOUNTER — Other Ambulatory Visit: Payer: Self-pay | Admitting: Endocrinology

## 2024-02-05 DIAGNOSIS — E1065 Type 1 diabetes mellitus with hyperglycemia: Secondary | ICD-10-CM | POA: Diagnosis not present

## 2024-02-05 DIAGNOSIS — E1042 Type 1 diabetes mellitus with diabetic polyneuropathy: Secondary | ICD-10-CM | POA: Diagnosis not present

## 2024-02-05 DIAGNOSIS — R928 Other abnormal and inconclusive findings on diagnostic imaging of breast: Secondary | ICD-10-CM

## 2024-02-05 DIAGNOSIS — E10319 Type 1 diabetes mellitus with unspecified diabetic retinopathy without macular edema: Secondary | ICD-10-CM | POA: Diagnosis not present

## 2024-02-13 DIAGNOSIS — E1065 Type 1 diabetes mellitus with hyperglycemia: Secondary | ICD-10-CM | POA: Diagnosis not present

## 2024-02-13 DIAGNOSIS — E1042 Type 1 diabetes mellitus with diabetic polyneuropathy: Secondary | ICD-10-CM | POA: Diagnosis not present

## 2024-02-13 DIAGNOSIS — E10319 Type 1 diabetes mellitus with unspecified diabetic retinopathy without macular edema: Secondary | ICD-10-CM | POA: Diagnosis not present

## 2024-02-21 ENCOUNTER — Ambulatory Visit

## 2024-02-21 ENCOUNTER — Ambulatory Visit
Admission: RE | Admit: 2024-02-21 | Discharge: 2024-02-21 | Disposition: A | Source: Ambulatory Visit | Attending: Endocrinology | Admitting: Endocrinology

## 2024-02-21 DIAGNOSIS — R928 Other abnormal and inconclusive findings on diagnostic imaging of breast: Secondary | ICD-10-CM | POA: Diagnosis not present

## 2024-03-04 DIAGNOSIS — E785 Hyperlipidemia, unspecified: Secondary | ICD-10-CM | POA: Diagnosis not present

## 2024-03-04 DIAGNOSIS — Z23 Encounter for immunization: Secondary | ICD-10-CM | POA: Diagnosis not present

## 2024-03-04 DIAGNOSIS — E1042 Type 1 diabetes mellitus with diabetic polyneuropathy: Secondary | ICD-10-CM | POA: Diagnosis not present

## 2024-03-04 DIAGNOSIS — Z4681 Encounter for fitting and adjustment of insulin pump: Secondary | ICD-10-CM | POA: Diagnosis not present

## 2024-03-04 DIAGNOSIS — E039 Hypothyroidism, unspecified: Secondary | ICD-10-CM | POA: Diagnosis not present

## 2024-03-04 DIAGNOSIS — F418 Other specified anxiety disorders: Secondary | ICD-10-CM | POA: Diagnosis not present

## 2024-03-04 DIAGNOSIS — M7741 Metatarsalgia, right foot: Secondary | ICD-10-CM | POA: Diagnosis not present

## 2024-03-04 DIAGNOSIS — I739 Peripheral vascular disease, unspecified: Secondary | ICD-10-CM | POA: Diagnosis not present
